# Patient Record
Sex: Female | Born: 1945 | Race: White | Hispanic: No | Smoking: Never smoker
Health system: Southern US, Community
[De-identification: ages and names within clinical notes are randomized; demographics above are authoritative.]

## PROBLEM LIST (undated history)

## (undated) DIAGNOSIS — F329 Major depressive disorder, single episode, unspecified: Secondary | ICD-10-CM

## (undated) DIAGNOSIS — C801 Malignant (primary) neoplasm, unspecified: Secondary | ICD-10-CM

## (undated) DIAGNOSIS — F419 Anxiety disorder, unspecified: Secondary | ICD-10-CM

## (undated) DIAGNOSIS — F40240 Claustrophobia: Secondary | ICD-10-CM

## (undated) DIAGNOSIS — F32A Depression, unspecified: Secondary | ICD-10-CM

## (undated) DIAGNOSIS — Z8619 Personal history of other infectious and parasitic diseases: Secondary | ICD-10-CM

## (undated) DIAGNOSIS — K219 Gastro-esophageal reflux disease without esophagitis: Secondary | ICD-10-CM

## (undated) HISTORY — DX: Depression, unspecified: F32.A

## (undated) HISTORY — DX: Personal history of other infectious and parasitic diseases: Z86.19

## (undated) HISTORY — DX: Claustrophobia: F40.240

## (undated) HISTORY — DX: Anxiety disorder, unspecified: F41.9

## (undated) HISTORY — DX: Malignant (primary) neoplasm, unspecified: C80.1

## (undated) HISTORY — PX: OOPHORECTOMY: SHX86

## (undated) HISTORY — PX: FOOT SURGERY: SHX648

## (undated) HISTORY — DX: Gastro-esophageal reflux disease without esophagitis: K21.9

## (undated) HISTORY — DX: Major depressive disorder, single episode, unspecified: F32.9

---

## 1996-10-30 DIAGNOSIS — Z853 Personal history of malignant neoplasm of breast: Secondary | ICD-10-CM | POA: Insufficient documentation

## 1996-10-30 HISTORY — PX: MASTECTOMY MODIFIED RADICAL: SHX5962

## 2012-05-24 LAB — TSH: TSH: 1.2 u[IU]/mL (ref <=5.90)

## 2012-05-24 LAB — LIPID PANEL
Cholesterol: 220 mg/dL — AB (ref 0–200)
HDL: 68 mg/dL (ref 35–70)
LDL Cholesterol: 136 mg/dL
Triglycerides: 78 mg/dL (ref 40–160)

## 2013-11-28 LAB — HM COLONOSCOPY

## 2014-10-31 LAB — HM MAMMOGRAPHY: HM MAMMO: NORMAL

## 2014-11-19 LAB — BASIC METABOLIC PANEL
BUN: 14 mg/dL (ref 4–21)
CREATININE: 0.8 mg/dL (ref <=1.1)

## 2014-11-19 LAB — CBC AND DIFFERENTIAL: HEMOGLOBIN: 13.8 g/dL (ref 12.0–16.0)

## 2015-05-26 ENCOUNTER — Encounter: Payer: Self-pay | Admitting: Internal Medicine

## 2015-05-26 ENCOUNTER — Other Ambulatory Visit: Payer: Self-pay | Admitting: Internal Medicine

## 2015-05-26 DIAGNOSIS — M810 Age-related osteoporosis without current pathological fracture: Secondary | ICD-10-CM | POA: Insufficient documentation

## 2015-05-26 DIAGNOSIS — K219 Gastro-esophageal reflux disease without esophagitis: Secondary | ICD-10-CM | POA: Insufficient documentation

## 2015-05-26 DIAGNOSIS — F064 Anxiety disorder due to known physiological condition: Secondary | ICD-10-CM | POA: Insufficient documentation

## 2015-05-26 DIAGNOSIS — Z8619 Personal history of other infectious and parasitic diseases: Secondary | ICD-10-CM | POA: Insufficient documentation

## 2015-05-26 DIAGNOSIS — F40240 Claustrophobia: Secondary | ICD-10-CM | POA: Insufficient documentation

## 2015-05-26 MED ORDER — LORAZEPAM 0.5 MG PO TABS: 0.5000 mg | ORAL_TABLET | Freq: Three times a day (TID) | ORAL | Status: DC | PRN

## 2015-11-16 ENCOUNTER — Encounter: Payer: Self-pay | Admitting: Internal Medicine

## 2015-11-16 ENCOUNTER — Ambulatory Visit (INDEPENDENT_AMBULATORY_CARE_PROVIDER_SITE_OTHER): Payer: Medicare Other | Admitting: Internal Medicine

## 2015-11-16 VITALS — BP 110/74 | HR 68 | Ht 67.5 in | Wt 145.0 lb

## 2015-11-16 DIAGNOSIS — E559 Vitamin D deficiency, unspecified: Secondary | ICD-10-CM

## 2015-11-16 DIAGNOSIS — Z79899 Other long term (current) drug therapy: Secondary | ICD-10-CM | POA: Diagnosis not present

## 2015-11-16 DIAGNOSIS — M129 Arthropathy, unspecified: Secondary | ICD-10-CM | POA: Diagnosis not present

## 2015-11-16 DIAGNOSIS — M1711 Unilateral primary osteoarthritis, right knee: Secondary | ICD-10-CM

## 2015-11-16 DIAGNOSIS — F064 Anxiety disorder due to known physiological condition: Secondary | ICD-10-CM

## 2015-11-16 DIAGNOSIS — Z23 Encounter for immunization: Secondary | ICD-10-CM | POA: Diagnosis not present

## 2015-11-16 DIAGNOSIS — Z853 Personal history of malignant neoplasm of breast: Secondary | ICD-10-CM | POA: Diagnosis not present

## 2015-11-16 NOTE — Progress Notes (Signed)
Date:  11/16/2015   Name:  Judy Sampson   DOB:  04-01-1946   MRN:  [id number]   Chief Complaint: Follow-up Anxiety Presents for follow-up visit. Patient reports no chest pain, palpitations or shortness of breath. Symptoms occur rarely. The severity of symptoms is mild. The quality of sleep is good. Nighttime awakenings: occasional.   Past treatments include benzodiazephines and SSRIs. The treatment provided significant relief. Compliance with prior treatments has been good.   History of Breast Cancer - followed by [facility] in [place].  They handle mammograms, CXR and exams.  She brings a list of labs needed.  They also treat her for Osteoporosis with Zometa.  They also manage ordering DEXA scans.  Right hip and knee pain - mild hip and knee aching at times.  Able to do almost everything she wants.  Takes tylenol as needed.  No weakness or falls.  Review of Systems  Constitutional: Negative for chills, diaphoresis and fatigue.  HENT: Negative for hearing loss.   Eyes: Negative for visual disturbance.  Respiratory: Negative for cough, chest tightness and shortness of breath.   Cardiovascular: Negative for chest pain, palpitations and leg swelling.  Genitourinary: Negative for dysuria.  Musculoskeletal: Positive for myalgias (right hip bursitis) and arthralgias (right knee).  Neurological: Negative for tremors, numbness and headaches.  Psychiatric/Behavioral: Negative for sleep disturbance and dysphoric mood.    Patient Active Problem List   Diagnosis Date Noted  . 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 Osteoporosis 05/26/2015  . Claustrophobia 05/26/2015  . Acid reflux 05/26/2015  . History of breast cancer in female 10/30/1996    Prior to Admission medications   Medication Sig Start Date End Date Taking? Authorizing Provider  MULTIPLE VITAMIN PO Take by mouth.   Yes Historical Provider, MD  Omega-3  Fatty Acids (FISH OIL BURP-LESS) 1000 MG CAPS Take by mouth.   Yes Historical Provider, MD  Probiotic CAPS Take by mouth.   Yes Historical Provider, MD  sertraline (ZOLOFT) 50 MG tablet Take 1 tablet by mouth daily. 11/19/14  Yes Historical Provider, MD  LORazepam (ATIVAN) 0.5 MG tablet Take 1 tablet (0.5 mg total) by mouth every 8 (eight) hours as needed for anxiety. Patient not taking: Reported on 11/16/2015 05/26/15   Glean Hess, MD    Allergies  Allergen Reactions  . Bisphosphonates     Past Surgical History  Procedure Laterality Date  . Oophorectomy Left   . Mastectomy modified radical Right 1998    XRT/Chemo + 5 yrs  Aromasin    Social History  Substance Use Topics  . Smoking status: Never Smoker   . Smokeless tobacco: Never Used  . Alcohol Use: 1.2 oz/week    2 Standard drinks or equivalent per week     Comment: occasional   Depression screen [facility] 2/9 11/16/2015  Decreased Interest 0  Down, Depressed, Hopeless 0  PHQ - 2 Score 0   Fall Risk  11/16/2015  Falls in the past year? No   Functional Status Survey: Is the patient deaf or have difficulty hearing?: No Does the patient have difficulty seeing, even when wearing glasses/contacts?: No Does the patient have difficulty concentrating, remembering, or making decisions?: No Does the patient have difficulty walking or climbing stairs?: No Does the patient have difficulty dressing or bathing?: No Does the patient have difficulty doing errands alone such as visiting a doctor's office or shopping?: No   Medication list has been  reviewed and updated.  Physical Exam  Constitutional: She is oriented to person, place, and time. She appears well-developed and well-nourished. No distress.  HENT:  Head: Normocephalic and atraumatic.  Neck: Normal range of motion. Neck supple. No thyromegaly present.  Cardiovascular: Normal rate, regular rhythm and normal heart sounds.   Pulmonary/Chest: Effort normal and breath sounds  normal. No respiratory distress.  Abdominal: Soft. Bowel sounds are normal.  Musculoskeletal: Normal range of motion. She exhibits no edema or tenderness.  Lymphadenopathy:    She has no cervical adenopathy.  Neurological: She is alert and oriented to person, place, and time. She has normal reflexes.  Skin: Skin is warm and dry. No rash noted.  Psychiatric: She has a normal mood and affect. Her behavior is normal. Thought content normal.  Nursing note and vitals reviewed.   BP 110/74 mmHg  Pulse 68  Ht 5' 7.5" (1.715 m)  Wt 145 lb (65.772 kg)  BMI 22.36 kg/m2  Assessment and Plan: 1. Anxiety disorder due to known physiological condition Doing well on low dose sertraline Has Lorazepam to use as needed but still has the whole Rx - TSH  2. History of breast cancer in female Followed by Oncology - CBC with Differential/Platelet  3. Arthritis of knee, right Continue activity as tolerated Tylenol prn  4. Vitamin D deficiency Continue supplement - Vitamin D 1,25 dihydroxy  5. Long term use of drug - Comprehensive metabolic panel - Uric acid - Lactate Dehydrogenase (LDH) - Magnesium - Phosphorus  6. Need for pneumococcal vaccination - Pneumococcal polysaccharide vaccine 23-valent greater than or equal to 2yo subcutaneous/IM   Halina Maidens, MD [facility]  11/16/2015

## 2015-11-16 NOTE — Patient Instructions (Signed)
Pneumococcal Polysaccharide Vaccine: What You Need to Know  1. Why get vaccinated?  Vaccination can protect older adults (and some children and younger adults) from pneumococcal disease.  Pneumococcal disease is caused by bacteria that can spread from person to person through close contact. It can cause ear infections, and it can also lead to more serious infections of the:   · Lungs (pneumonia),  · Blood (bacteremia), and  · Covering of the brain and spinal cord (meningitis). Meningitis can cause deafness and brain damage, and it can be fatal.  Anyone can get pneumococcal disease, but children under 2 years of age, people with certain medical conditions, adults over 65 years of age, and cigarette smokers are at the highest risk.  About 18,000 older adults die each year from pneumococcal disease in the [place].  Treatment of pneumococcal infections with penicillin and other drugs used to be more effective. But some strains of the disease have become resistant to these drugs. This makes prevention of the disease, through vaccination, even more important.  2. Pneumococcal polysaccharide vaccine (PPSV23)  Pneumococcal polysaccharide vaccine (PPSV23) protects against 23 types of pneumococcal bacteria. It will not prevent all pneumococcal disease.  PPSV23 is recommended for:  · All adults 65 years of age and older,  · Anyone 2 through 68 years of age with certain long-term health problems,  · Anyone 2 through 68 years of age with a weakened immune system,  · Adults 19 through 68 years of age who smoke cigarettes or have asthma.  Most people need only one dose of PPSV. A second dose is recommended for certain high-risk groups. People 65 and older should get a dose even if they have gotten one or more doses of the vaccine before they turned 65.  Your healthcare provider can give you more information about these recommendations.  Most healthy adults develop protection within 2 to 3 weeks of getting the shot.  3. Some  people should not get this vaccine  · Anyone who has had a life-threatening allergic reaction to PPSV should not get another dose.  · Anyone who has a severe allergy to any component of PPSV should not receive it. Tell your provider if you have any severe allergies.  · Anyone who is moderately or severely ill when the shot is scheduled may be asked to wait until they recover before getting the vaccine. Someone with a mild illness can usually be vaccinated.  · Children less than 2 years of age should not receive this vaccine.  · There is no evidence that PPSV is harmful to either a pregnant woman or to her fetus. However, as a precaution, women who need the vaccine should be vaccinated before becoming pregnant, if possible.  4. Risks of a vaccine reaction  With any medicine, including vaccines, there is a chance of side effects. These are usually mild and go away on their own, but serious reactions are also possible.  About half of people who get PPSV have mild side effects, such as redness or pain where the shot is given, which go away within about two days.  Less than 1 out of 100 people develop a fever, muscle aches, or more severe local reactions.  Problems that could happen after any vaccine:  · People sometimes faint after a medical procedure, including vaccination. Sitting or lying down for about 15 minutes can help prevent fainting, and injuries caused by a fall. Tell your doctor if you feel dizzy, or have vision changes or   ringing in the ears.  · Some people get severe pain in the shoulder and have difficulty moving the arm where a shot was given. This happens very rarely.  · Any medication can cause a severe allergic reaction. Such reactions from a vaccine are very rare, estimated at about 1 in a million doses, and would happen within a few minutes to a few hours after the vaccination.  As with any medicine, there is a very remote chance of a vaccine causing a serious injury or death.  The safety of  vaccines is always being monitored. For more information, visit: www.cdc.gov/vaccinesafety/  5. What if there is a serious reaction?  What should I look for?  Look for anything that concerns you, such as signs of a severe allergic reaction, very high fever, or unusual behavior.   Signs of a severe allergic reaction can include hives, swelling of the face and throat, difficulty breathing, a fast heartbeat, dizziness, and weakness. These would usually start a few minutes to a few hours after the vaccination.  What should I do?  If you think it is a severe allergic reaction or other emergency that can't wait, call 9-1-1 or get to the nearest hospital. Otherwise, call your doctor.  Afterward, the reaction should be reported to the Vaccine Adverse Event Reporting System (VAERS). Your doctor might file this report, or you can do it yourself through the VAERS web site at www.vaers.hhs.gov, or by calling 1-800-822-7967.   VAERS does not give medical advice.  6. How can I learn more?  · Ask your doctor. He or she can give you the vaccine package insert or suggest other sources of information.  · Call your local or state health department.  · Contact the Centers for Disease Control and Prevention (CDC):    Call 1-800-232-4636 (1-800-CDC-INFO) or    Visit CDC's website at www.cdc.gov/vaccines  CDC Pneumococcal Polysaccharide Vaccine VIS (02/20/14)     This information is not intended to replace advice given to you by your health care provider. Make sure you discuss any questions you have with your health care provider.     Document Released: 08/13/2006 Document Revised: 11/06/2014 Document Reviewed: 02/23/2014  Elsevier Interactive Patient Education ©2016 Elsevier Inc.

## 2015-11-20 LAB — CBC WITH DIFFERENTIAL/PLATELET
BASOS ABS: 0 10*3/uL (ref 0.0–0.2)
Basos: 1 %
EOS (ABSOLUTE): 0.1 10*3/uL (ref 0.0–0.4)
Eos: 1 %
HEMATOCRIT: 39.3 % (ref 34.0–46.6)
Hemoglobin: 13.1 g/dL (ref 11.1–15.9)
Immature Grans (Abs): 0 10*3/uL (ref 0.0–0.1)
Immature Granulocytes: 1 %
LYMPHS ABS: 2 10*3/uL (ref 0.7–3.1)
Lymphs: 38 %
MCH: 29.6 pg (ref 26.6–33.0)
MCHC: 33.3 g/dL (ref 31.5–35.7)
MCV: 89 fL (ref 79–97)
Monocytes Absolute: 0.4 10*3/uL (ref 0.1–0.9)
Monocytes: 8 %
NRBC: 0 % (ref 0–0)
Neutrophils Absolute: 2.8 10*3/uL (ref 1.4–7.0)
Neutrophils: 51 %
Platelets: 215 10*3/uL (ref 150–379)
RBC: 4.42 x10E6/uL (ref 3.77–5.28)
RDW: 13.8 % (ref 12.3–15.4)
WBC: 5.4 10*3/uL (ref 3.4–10.8)

## 2015-11-20 LAB — COMPREHENSIVE METABOLIC PANEL
ALT: 19 IU/L (ref 0–32)
AST: 24 IU/L (ref 0–40)
Albumin/Globulin Ratio: 1.6 (ref 1.1–2.5)
Albumin: 4.2 g/dL (ref 3.6–4.8)
Alkaline Phosphatase: 67 IU/L (ref 39–117)
BUN/Creatinine Ratio: 16 (ref 11–26)
BUN: 14 mg/dL (ref 8–27)
Bilirubin Total: 0.2 mg/dL (ref 0.0–1.2)
CALCIUM: 9.6 mg/dL (ref 8.7–10.3)
CO2: 25 mmol/L (ref 18–29)
Chloride: 99 mmol/L (ref 96–106)
Creatinine, Ser: 0.86 mg/dL (ref 0.57–1.00)
GFR calc Af Amer: 81 mL/min/{1.73_m2} (ref >59)
GFR, EST NON AFRICAN AMERICAN: 70 mL/min/{1.73_m2} (ref >59)
GLOBULIN, TOTAL: 2.7 g/dL (ref 1.5–4.5)
Glucose: 77 mg/dL (ref 65–99)
Potassium: 4.7 mmol/L (ref 3.5–5.2)
SODIUM: 140 mmol/L (ref 134–144)
Total Protein: 6.9 g/dL (ref 6.0–8.5)

## 2015-11-20 LAB — TSH: TSH: 1.14 u[IU]/mL (ref 0.450–4.500)

## 2015-11-20 LAB — PHOSPHORUS: Phosphorus: 3.4 mg/dL (ref 2.5–4.5)

## 2015-11-20 LAB — MAGNESIUM: Magnesium: 2.2 mg/dL (ref 1.6–2.3)

## 2015-11-20 LAB — URIC ACID: Uric Acid: 4.5 mg/dL (ref 2.5–7.1)

## 2015-11-20 LAB — VITAMIN D 1,25 DIHYDROXY
VITAMIN D3 1, 25 (OH): 78 pg/mL
Vitamin D 1, 25 (OH)2 Total: 82 pg/mL
Vitamin D2 1, 25 (OH)2: <10 pg/mL

## 2015-11-20 LAB — LACTATE DEHYDROGENASE: LDH: 175 IU/L (ref 119–226)

## 2015-11-22 ENCOUNTER — Telehealth: Payer: Self-pay

## 2015-11-22 NOTE — Telephone Encounter (Signed)
Spoke with pt. Pt. Advised and I have sent a copy to her oncologist as requested.

## 2015-11-22 NOTE — Telephone Encounter (Signed)
-----   Message from Glean Hess, MD sent at 11/20/2015  5:06 PM EST ----- Labs are all normal.  MH - please copy and fax to her oncologist (sheet is in my box).

## 2015-11-22 NOTE — Progress Notes (Signed)
Spoke with pt. Pt. Advised of results. I have faxed her results to her oncologist as requested.

## 2016-02-21 ENCOUNTER — Other Ambulatory Visit: Payer: Self-pay

## 2016-02-21 MED ORDER — SERTRALINE HCL 50 MG PO TABS: 50.0000 mg | ORAL_TABLET | Freq: Every day | ORAL | Status: DC

## 2016-02-21 NOTE — Telephone Encounter (Signed)
Patient called in requesting refills of medication.

## 2016-05-15 ENCOUNTER — Encounter: Payer: Self-pay | Admitting: Internal Medicine

## 2016-05-15 ENCOUNTER — Ambulatory Visit (INDEPENDENT_AMBULATORY_CARE_PROVIDER_SITE_OTHER): Payer: Medicare Other | Admitting: Internal Medicine

## 2016-05-15 VITALS — BP 120/80 | HR 84 | Resp 16 | Ht 67.5 in | Wt 143.0 lb

## 2016-05-15 DIAGNOSIS — F064 Anxiety disorder due to known physiological condition: Secondary | ICD-10-CM

## 2016-05-15 DIAGNOSIS — F40240 Claustrophobia: Secondary | ICD-10-CM | POA: Diagnosis not present

## 2016-05-15 MED ORDER — CLONAZEPAM 0.5 MG PO TABS: 0.2500 mg | ORAL_TABLET | Freq: Two times a day (BID) | ORAL | Status: DC | PRN

## 2016-05-15 NOTE — Progress Notes (Signed)
Date:  05/15/2016   Name:  Judy Sampson   DOB:  Aug 13, 1946   MRN:  [id number]   Chief Complaint: Anxiety Anxiety Presents for follow-up visit. Symptoms include excessive worry and nervous/anxious behavior. Patient reports no chest pain, palpitations or shortness of breath.   Past treatments include SSRIs and benzodiazephines. Compliance with prior treatments has been good.  She is contemplating flying to visit her daughter out of state.  Daughter is having psychological issues and she is afraid that the lorazepam will not be strong enough.    She had a similar issue about 5 years ago.  She was given clonazepam 0.5 mg bid - it worked very well and she would like to have that to take if needed.    Review of Systems  Constitutional: Negative for chills, diaphoresis and fatigue.  HENT: Negative for hearing loss.   Eyes: Negative for visual disturbance.  Respiratory: Negative for cough, chest tightness and shortness of breath.   Cardiovascular: Negative for chest pain, palpitations and leg swelling.  Genitourinary: Negative for dysuria.  Neurological: Negative for tremors, numbness and headaches.  Psychiatric/Behavioral: Negative for sleep disturbance and dysphoric mood. The patient is nervous/anxious.     Patient Active Problem List   Diagnosis Date Noted  . 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 Osteoporosis 05/26/2015  . Claustrophobia 05/26/2015  . Acid reflux 05/26/2015  . History of breast cancer in female 10/30/1996    Prior to Admission medications   Medication Sig Start Date End Date Taking? Authorizing Provider  LORazepam (ATIVAN) 0.5 MG tablet Take 1 tablet (0.5 mg total) by mouth every 8 (eight) hours as needed for anxiety. 05/26/15  Yes Glean Hess, MD  MULTIPLE VITAMIN PO Take by mouth.   Yes Historical Provider, MD  Omega-3 Fatty Acids (FISH OIL BURP-LESS) 1000 MG CAPS Take by mouth.   Yes  Historical Provider, MD  Probiotic CAPS Take by mouth.   Yes Historical Provider, MD  sertraline (ZOLOFT) 50 MG tablet Take 1 tablet (50 mg total) by mouth daily. 02/21/16  Yes Glean Hess, MD    Allergies  Allergen Reactions  . Bisphosphonates     Past Surgical History  Procedure Laterality Date  . Oophorectomy Left   . Mastectomy modified radical Right 1998    XRT/Chemo + 5 yrs  Aromasin    Social History  Substance Use Topics  . Smoking status: Never Smoker   . Smokeless tobacco: Never Used  . Alcohol Use: 1.2 oz/week    2 Standard drinks or equivalent per week     Comment: occasional     Medication list has been reviewed and updated.   Physical Exam  Constitutional: She is oriented to person, place, and time. She appears well-developed. No distress.  HENT:  Head: Normocephalic and atraumatic.  Pulmonary/Chest: Effort normal. No respiratory distress.  Musculoskeletal: Normal range of motion.  Neurological: She is alert and oriented to person, place, and time.  Skin: Skin is warm and dry. No rash noted.  Psychiatric: Her behavior is normal. Thought content normal. Her mood appears anxious.    BP 120/80 mmHg  Pulse 84  Resp 16  Ht 5' 7.5" (1.715 m)  Wt 143 lb (64.864 kg)  BMI 22.05 kg/m2  SpO2 98%  Assessment and Plan: 1. Anxiety disorder due to known physiological condition - clonazePAM (KLONOPIN) 0.5 MG tablet; Take 0.5 tablets (0.25 mg total) by mouth 2 (two) times daily  as needed for anxiety.  Dispense: 20 tablet; Refill: 0  2. Clover, MD [facility]  05/15/2016

## 2016-10-04 ENCOUNTER — Ambulatory Visit (INDEPENDENT_AMBULATORY_CARE_PROVIDER_SITE_OTHER): Payer: Medicare Other | Admitting: Internal Medicine

## 2016-10-04 ENCOUNTER — Encounter: Payer: Self-pay | Admitting: Internal Medicine

## 2016-10-04 VITALS — BP 110/74 | HR 72 | Resp 16 | Ht 67.5 in | Wt 140.0 lb

## 2016-10-04 DIAGNOSIS — Z0000 Encounter for general adult medical examination without abnormal findings: Secondary | ICD-10-CM

## 2016-10-04 DIAGNOSIS — Z853 Personal history of malignant neoplasm of breast: Secondary | ICD-10-CM

## 2016-10-04 DIAGNOSIS — H9319 Tinnitus, unspecified ear: Secondary | ICD-10-CM | POA: Diagnosis not present

## 2016-10-04 DIAGNOSIS — Z1159 Encounter for screening for other viral diseases: Secondary | ICD-10-CM | POA: Diagnosis not present

## 2016-10-04 DIAGNOSIS — F064 Anxiety disorder due to known physiological condition: Secondary | ICD-10-CM | POA: Diagnosis not present

## 2016-10-04 DIAGNOSIS — M62838 Other muscle spasm: Secondary | ICD-10-CM | POA: Insufficient documentation

## 2016-10-04 DIAGNOSIS — M810 Age-related osteoporosis without current pathological fracture: Secondary | ICD-10-CM | POA: Diagnosis not present

## 2016-10-04 DIAGNOSIS — I499 Cardiac arrhythmia, unspecified: Secondary | ICD-10-CM | POA: Diagnosis not present

## 2016-10-04 DIAGNOSIS — M899 Disorder of bone, unspecified: Secondary | ICD-10-CM

## 2016-10-04 LAB — POCT URINALYSIS DIPSTICK
BILIRUBIN UA: NEGATIVE
Blood, UA: NEGATIVE
GLUCOSE UA: NEGATIVE
KETONES UA: NEGATIVE
LEUKOCYTES UA: NEGATIVE
NITRITE UA: NEGATIVE
PH UA: 6
Protein, UA: NEGATIVE
Spec Grav, UA: <=1.005
Urobilinogen, UA: 0.2

## 2016-10-04 MED ORDER — SERTRALINE HCL 50 MG PO TABS: 50.0000 mg | ORAL_TABLET | Freq: Every day | ORAL | 3 refills | Status: DC

## 2016-10-04 NOTE — Patient Instructions (Signed)
Health Maintenance  Topic Date Due  . Hepatitis C Screening  [birth year]  . MAMMOGRAM  11/01/2015  . COLONOSCOPY  11/29/2023  . TETANUS/TDAP  08/30/2025  . INFLUENZA VACCINE  Addressed  . DEXA SCAN  Addressed  . ZOSTAVAX  Completed  . PNA vac Low Risk Adult  Completed

## 2016-10-04 NOTE — Progress Notes (Signed)
Patient: Judy Sampson, Female    DOB: 06-29-1946, 70 y.o.   MRN: [id number] Visit Date: 10/04/2016  Today's Provider: Halina Maidens, MD   Chief Complaint  Patient presents with  . Medicare Wellness   Subjective:    Annual wellness visit Judy Sampson is a 70 y.o. female who presents today for her Subsequent Annual Wellness Visit. She feels fairly well. She reports exercising walking daily. She reports she is sleeping well.   ----------------------------------------------------------- Anxiety  Presents for follow-up visit. Patient reports no chest pain, decreased concentration, dizziness, nervous/anxious behavior or palpitations. Symptoms occur occasionally. The quality of sleep is good.    Tender scalp - noticed on the left recently.  Has some sinus congestion most of the time.  Feels like buzzing sound in head as well.  She has seen the chiropractor once and felt better briefly.  Palpitations - she has noticed three or four heavy beats in her chest, mostly at night while relaxing to go to sleep.  No associated pain or shortness of breath.  They do not occur sx during the day or with exercise.  Tinnitus - not sure which side but hears a humming sound.  No ear pain or decreased hearing.  She has some chronic sinus congestion for which she takes a homeopathic remedy.  Review of Systems  Constitutional: Negative for chills, fatigue, fever and unexpected weight change.  HENT: Positive for congestion, sinus pressure and tinnitus. Negative for dental problem, ear pain, hearing loss, trouble swallowing and voice change.   Eyes: Negative for visual disturbance.  Cardiovascular: Negative for chest pain, palpitations and leg swelling.  Gastrointestinal: Negative for abdominal pain, blood in stool, constipation, diarrhea and vomiting.  Endocrine: Negative for polydipsia and polyuria.  Genitourinary: Negative for dysuria, frequency and urgency.  Musculoskeletal: Positive for neck stiffness.  Negative for arthralgias and gait problem.  Skin: Negative for rash.  Allergic/Immunologic: Positive for environmental allergies.  Neurological: Positive for headaches. Negative for dizziness, tremors, weakness, light-headedness and numbness.  Psychiatric/Behavioral: Negative for decreased concentration and sleep disturbance. The patient is not nervous/anxious.     Social History   Social History  . Marital status: Unknown    Spouse name: N/A  . Number of children: N/A  . Years of education: N/A   Occupational History  . Not on file.   Social History Main Topics  . Smoking status: Never Smoker  . Smokeless tobacco: Never Used  . Alcohol use 1.2 oz/week    2 Standard drinks or equivalent per week     Comment: occasional  . Drug use: No  . Sexual activity: Not on file   Other Topics Concern  . Not on file   Social History Narrative  . No narrative on file    Patient Active Problem List   Diagnosis Date Noted  . Age-related osteoporosis without current pathological fracture 10/04/2016  . 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 Claustrophobia 05/26/2015  . Acid reflux 05/26/2015  . History of breast cancer in female 10/30/1996    Past Surgical History:  Procedure Laterality Date  . MASTECTOMY MODIFIED RADICAL Right 1998   XRT/Chemo + 5 yrs  Aromasin  . OOPHORECTOMY Left     Her family history includes Diabetes in her father and mother.     Previous Medications   MULTIPLE VITAMIN PO    Take by mouth.   OMEGA-3 FATTY ACIDS (FISH OIL BURP-LESS) 1000 MG CAPS    Take  by mouth.   PROBIOTIC CAPS    Take by mouth.   SERTRALINE (ZOLOFT) 50 MG TABLET    Take 1 tablet (50 mg total) by mouth daily.    Patient Care Team: Glean Hess, MD as PCP - General (Family Medicine)      Objective:   Vitals: BP 110/74   Pulse 72   Resp 16   Ht 5' 7.5" (1.715 m)   Wt 140 lb (63.5 kg)   SpO2 100%   BMI  21.60 kg/m   Physical Exam  Constitutional: She is oriented to person, place, and time. She appears well-developed and well-nourished. No distress.  HENT:  Head: Normocephalic and atraumatic.  Right Ear: Tympanic membrane and ear canal normal.  Left Ear: Tympanic membrane and ear canal normal.  Nose: Right sinus exhibits no maxillary sinus tenderness. Left sinus exhibits no maxillary sinus tenderness.  Mouth/Throat: Uvula is midline and oropharynx is clear and moist.  Eyes: Conjunctivae and EOM are normal. Right eye exhibits no discharge. Left eye exhibits no discharge. No scleral icterus.  Neck: Normal range of motion. Carotid bruit is not present. No erythema present. No thyromegaly present.  Cardiovascular: Normal rate, regular rhythm, normal heart sounds, intact distal pulses and normal pulses.   Occasional extrasystoles are present.  Pulmonary/Chest: Effort normal. No respiratory distress. She has no wheezes. Right breast exhibits skin change (post mastectomy scar with no nodules). Left breast exhibits no mass, no nipple discharge, no skin change and no tenderness.  Abdominal: Soft. Bowel sounds are normal. There is no hepatosplenomegaly. There is no tenderness. There is no CVA tenderness.  Musculoskeletal: Normal range of motion.       Cervical back: She exhibits tenderness and spasm.  No Temporal artery tenderness; no TMJ pain or click  Lymphadenopathy:    She has no cervical adenopathy.    She has no axillary adenopathy.  Neurological: She is alert and oriented to person, place, and time. She has normal reflexes. No cranial nerve deficit or sensory deficit.  Skin: Skin is warm, dry and intact. No rash noted.  Psychiatric: She has a normal mood and affect. Her speech is normal and behavior is normal. Thought content normal.  Nursing note and vitals reviewed.   Activities of Daily Living In your present state of health, do you have any difficulty performing the following  activities: 10/04/2016 05/15/2016  Hearing? N N  Vision? N N  Difficulty concentrating or making decisions? N N  Walking or climbing stairs? N N  Dressing or bathing? N N  Doing errands, shopping? N N  Preparing Food and eating ? N -  Using the Toilet? N -  In the past six months, have you accidently leaked urine? Y -  Do you have problems with loss of bowel control? N -  Managing your Medications? N -  Managing your Finances? N -  Housekeeping or managing your Housekeeping? N -  Some recent data might be hidden    Fall Risk Assessment Fall Risk  10/04/2016 05/15/2016 11/16/2015  Falls in the past year? No No No      Depression Screen PHQ 2/9 Scores 10/04/2016 05/15/2016 11/16/2015  PHQ - 2 Score 0 0 0    6CIT Screen 10/04/2016  What Year? 0 points  What month? 0 points  What time? 0 points  Count back from 20 0 points  Months in reverse 0 points  Repeat phrase 0 points  Total Score 0    Medicare Annual Wellness  Visit Summary:  Reviewed patient's Family Medical History Reviewed and updated list of patient's medical providers Assessment of cognitive impairment was done Assessed patient's functional ability Established a written schedule for health screening [place] Completed and Reviewed  Exercise Activities and Dietary recommendations Goals    None      Immunization History  Administered Date(s) Administered  . Influenza-Unspecified 07/01/2015  . Pneumococcal Conjugate-13 11/19/2014  . Pneumococcal Polysaccharide-23 11/16/2015  . Tdap 08/31/2015  . Zoster 11/01/2007    Health Maintenance  Topic Date Due  . Hepatitis C Screening  November 26, 1945  . MAMMOGRAM  11/01/2015  . COLONOSCOPY  11/29/2023  . TETANUS/TDAP  08/30/2025  . INFLUENZA VACCINE  Addressed  . DEXA SCAN  Addressed  . ZOSTAVAX  Completed  . PNA vac Low Risk Adult  Completed    Discussed health benefits of physical activity, and encouraged her to engage in regular exercise  appropriate for her age and condition.    ------------------------------------------------------------------------------------------------------------  Assessment & Plan:  1. Medicare annual wellness visit, subsequent Measures satisfied - POCT urinalysis dipstick - Lipid panel  2. Anxiety disorder due to known physiological condition Controlled - continue Zoloft 50 mg  - TSH  3. Age-related osteoporosis without current pathological fracture Continue calcium + D and exercise  4. Need for hepatitis C screening test - Hepatitis C antibody  5. Cardiac arrhythmia, unspecified cardiac arrhythmia type PAC single caught on RS - patient reassured - EKG 12-Lead - Comprehensive metabolic panel - TSH  6. Bone disease Followed by Oncology - intolerant of fosamax - VITAMIN D 25 Hydroxy (Vit-D Deficiency, Fractures) - Uric acid - Magnesium - Phosphorus - Lactate Dehydrogenase (LDH)  7. Personal history of malignant neoplasm of breast Followed by Oncology who orders Mammograms - CBC with Differential/Platelet - Comprehensive metabolic panel  8. Muscle spasms of neck Advil, heat and chiropractic care  9. Tinnitus, unspecified laterality If persistent, will refer to ENT   Halina Maidens, MD [facility]  10/04/2016

## 2016-10-05 LAB — CBC WITH DIFFERENTIAL/PLATELET
Basophils Absolute: 0 10*3/uL (ref 0.0–0.2)
Basos: 0 %
EOS (ABSOLUTE): 0.1 10*3/uL (ref 0.0–0.4)
Eos: 2 %
HEMATOCRIT: 39.7 % (ref 34.0–46.6)
Hemoglobin: 13.2 g/dL (ref 11.1–15.9)
IMMATURE GRANULOCYTES: 0 %
Immature Grans (Abs): 0 10*3/uL (ref 0.0–0.1)
LYMPHS: 40 %
Lymphocytes Absolute: 1.9 10*3/uL (ref 0.7–3.1)
MCH: 29.4 pg (ref 26.6–33.0)
MCHC: 33.2 g/dL (ref 31.5–35.7)
MCV: 88 fL (ref 79–97)
MONOCYTES: 11 %
MONOS ABS: 0.5 10*3/uL (ref 0.1–0.9)
NEUTROS ABS: 2.1 10*3/uL (ref 1.4–7.0)
Neutrophils: 47 %
PLATELETS: 208 10*3/uL (ref 150–379)
RBC: 4.49 x10E6/uL (ref 3.77–5.28)
RDW: 14 % (ref 12.3–15.4)
WBC: 4.6 10*3/uL (ref 3.4–10.8)

## 2016-10-05 LAB — COMPREHENSIVE METABOLIC PANEL
A/G RATIO: 1.7 (ref 1.2–2.2)
ALK PHOS: 67 IU/L (ref 39–117)
ALT: 18 IU/L (ref 0–32)
AST: 24 IU/L (ref 0–40)
Albumin: 4.4 g/dL (ref 3.6–4.8)
BILIRUBIN TOTAL: 0.4 mg/dL (ref 0.0–1.2)
BUN/Creatinine Ratio: 28 (ref 12–28)
BUN: 19 mg/dL (ref 8–27)
CHLORIDE: 100 mmol/L (ref 96–106)
CO2: 27 mmol/L (ref 18–29)
Calcium: 9.6 mg/dL (ref 8.7–10.3)
Creatinine, Ser: 0.69 mg/dL (ref 0.57–1.00)
GFR calc Af Amer: 103 mL/min/{1.73_m2} (ref >59)
GFR calc non Af Amer: 90 mL/min/{1.73_m2} (ref >59)
GLOBULIN, TOTAL: 2.6 g/dL (ref 1.5–4.5)
Glucose: 82 mg/dL (ref 65–99)
POTASSIUM: 4.7 mmol/L (ref 3.5–5.2)
SODIUM: 139 mmol/L (ref 134–144)
Total Protein: 7 g/dL (ref 6.0–8.5)

## 2016-10-05 LAB — URIC ACID: URIC ACID: 4.5 mg/dL (ref 2.5–7.1)

## 2016-10-05 LAB — PHOSPHORUS: PHOSPHORUS: 3.6 mg/dL (ref 2.5–4.5)

## 2016-10-05 LAB — MAGNESIUM: MAGNESIUM: 2.1 mg/dL (ref 1.6–2.3)

## 2016-10-05 LAB — HEPATITIS C ANTIBODY

## 2016-10-05 LAB — LIPID PANEL
CHOL/HDL RATIO: 3 ratio (ref 0.0–4.4)
Cholesterol, Total: 252 mg/dL — ABNORMAL HIGH (ref 100–199)
HDL: 83 mg/dL (ref >39)
LDL Calculated: 156 mg/dL — ABNORMAL HIGH (ref 0–99)
Triglycerides: 66 mg/dL (ref 0–149)
VLDL Cholesterol Cal: 13 mg/dL (ref 5–40)

## 2016-10-05 LAB — VITAMIN D 25 HYDROXY (VIT D DEFICIENCY, FRACTURES): VIT D 25 HYDROXY: 34.5 ng/mL (ref 30.0–100.0)

## 2016-10-05 LAB — TSH: TSH: 0.949 u[IU]/mL (ref 0.450–4.500)

## 2016-10-05 LAB — LACTATE DEHYDROGENASE: LDH: 187 IU/L (ref 119–226)

## 2017-02-08 LAB — HM DEXA SCAN

## 2017-03-07 ENCOUNTER — Other Ambulatory Visit: Payer: Self-pay | Admitting: Internal Medicine

## 2017-03-07 ENCOUNTER — Other Ambulatory Visit: Payer: Self-pay

## 2017-03-07 MED ORDER — SERTRALINE HCL 50 MG PO TABS: ORAL_TABLET | ORAL | 1 refills | Status: DC

## 2017-03-07 NOTE — Telephone Encounter (Signed)
Patient states she will call back later for CPE.

## 2017-07-03 ENCOUNTER — Ambulatory Visit
Admission: RE | Admit: 2017-07-03 | Discharge: 2017-07-03 | Disposition: A | Discharge status: Home / Self Care | Payer: Medicare Other | Source: Ambulatory Visit | Attending: Internal Medicine | Admitting: Internal Medicine

## 2017-07-03 ENCOUNTER — Other Ambulatory Visit: Payer: Self-pay | Admitting: Internal Medicine

## 2017-07-03 ENCOUNTER — Encounter: Payer: Self-pay | Admitting: Internal Medicine

## 2017-07-03 ENCOUNTER — Ambulatory Visit (INDEPENDENT_AMBULATORY_CARE_PROVIDER_SITE_OTHER): Payer: Medicare Other | Admitting: Internal Medicine

## 2017-07-03 VITALS — BP 110/64 | HR 77 | Ht 67.5 in | Wt 139.0 lb

## 2017-07-03 DIAGNOSIS — M19072 Primary osteoarthritis, left ankle and foot: Secondary | ICD-10-CM | POA: Diagnosis not present

## 2017-07-03 DIAGNOSIS — M79672 Pain in left foot: Secondary | ICD-10-CM | POA: Insufficient documentation

## 2017-07-03 DIAGNOSIS — D126 Benign neoplasm of colon, unspecified: Secondary | ICD-10-CM | POA: Diagnosis not present

## 2017-07-03 DIAGNOSIS — M7989 Other specified soft tissue disorders: Secondary | ICD-10-CM | POA: Diagnosis not present

## 2017-07-03 DIAGNOSIS — F064 Anxiety disorder due to known physiological condition: Secondary | ICD-10-CM

## 2017-07-03 MED ORDER — CLONAZEPAM 0.5 MG PO TABS: 0.2500 mg | ORAL_TABLET | Freq: Two times a day (BID) | ORAL | 0 refills | Status: DC | PRN

## 2017-07-03 NOTE — Progress Notes (Signed)
Date:  07/03/2017   Name:  Judy Sampson   DOB:  Apr 20, 1946   MRN:  [id number]   Chief Complaint: Foot Pain (Left foot pain on outer foot. Got new walking shoes noticed some change in foot. Wants make sure its nothing concerning. ) and Anxiety (Requesting refill on Lorazepam. States she only takes it when she has to fly. Has been several years since previous. ) Foot Pain  This is a new problem. The current episode started more than 1 month ago. The problem occurs constantly. The problem has been unchanged. Associated symptoms include arthralgias and myalgias. Pertinent negatives include no abdominal pain, chest pain, chills or fatigue. She has tried position changes for the symptoms. The treatment provided no relief.  Anxiety  Presents for follow-up visit. Symptoms include excessive worry. Patient reports no chest pain, nervous/anxious behavior, palpitations or shortness of breath. Symptoms occur rarely (only when flying).    Colon Polyps - she had adenomatous polyps on colonoscopy 3-1/2 years ago. She is now due for follow-up. She was seen at [place] did not like the facility. She like to go to [facility].    Review of Systems  Constitutional: Negative for chills and fatigue.  Respiratory: Negative for chest tightness, shortness of breath and wheezing.   Cardiovascular: Negative for chest pain and palpitations.  Gastrointestinal: Negative for abdominal pain.  Musculoskeletal: Positive for arthralgias, gait problem and myalgias.  Psychiatric/Behavioral: Negative for dysphoric mood. The patient is not nervous/anxious.     Patient Active Problem List   Diagnosis Date Noted  . Age-related osteoporosis without current pathological fracture 10/04/2016  . Muscle spasms of neck 10/04/2016  . Bone disease 10/04/2016  . Cardiac arrhythmia 10/04/2016  . 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 Claustrophobia  05/26/2015  . Acid reflux 05/26/2015  . Personal history of malignant neoplasm of breast 10/30/1996    Prior to Admission medications   Medication Sig Start Date End Date Taking? Authorizing Provider  Flaxseed, Linseed, (FLAX SEED OIL) 1000 MG CAPS Take by mouth.   Yes [provider]  MULTIPLE VITAMIN PO Take by mouth.   Yes [provider]  Omega-3 Fatty Acids (FISH OIL BURP-LESS) 1000 MG CAPS Take by mouth.   Yes [provider]  Probiotic CAPS Take by mouth.   Yes [provider]  sertraline (ZOLOFT) 50 MG tablet TAKE 1 TABLET(50 MG) BY MOUTH DAILY 03/07/17  Yes Glean Hess, MD    Allergies  Allergen Reactions  . Bisphosphonates     Past Surgical History:  Procedure Laterality Date  . MASTECTOMY MODIFIED RADICAL Right 1998   XRT/Chemo + 5 yrs  Aromasin  . OOPHORECTOMY Left     Social History  Substance Use Topics  . Smoking status: Never Smoker  . Smokeless tobacco: Never Used  . Alcohol use 1.2 oz/week    2 Standard drinks or equivalent per week     Comment: occasional     Medication list has been reviewed and updated.  PHQ 2/9 Scores 10/04/2016 05/15/2016 11/16/2015  PHQ - 2 Score 0 0 0    Physical Exam  Constitutional: She is oriented to person, place, and time. She appears well-developed. No distress.  HENT:  Head: Normocephalic and atraumatic.  Neck: Normal range of motion. Neck supple.  Cardiovascular: Normal rate, regular rhythm and normal heart sounds.   Pulmonary/Chest: Effort normal and breath sounds normal. No respiratory distress.  Musculoskeletal:  She exhibits no edema.       Feet:  Neurological: She is alert and oriented to person, place, and time.  Skin: Skin is warm and dry. No rash noted.  Psychiatric: She has a normal mood and affect. Her behavior is normal. Thought content normal.  Nursing note and vitals reviewed.   BP 110/64   Pulse 77   Ht 5' 7.5" (1.715 m)   Wt 139 lb (63 kg)   SpO2 99%    BMI 21.45 kg/m   Assessment and Plan: 1. Anxiety disorder due to known physiological condition Refill for upcoming travel to [place] - clonazePAM (KLONOPIN) 0.5 MG tablet; Take 0.5 tablets (0.25 mg total) by mouth 2 (two) times daily as needed for anxiety.  Dispense: 20 tablet; Refill: 0  2. Adenomatous polyp of colon, unspecified part of colon Call [facility] and ask for another service location If needed, we can do another referral  3. Foot pain, left Wear comfortable shoes May need Podiatry evaluation - DG Foot Complete Left; Future   Meds ordered this encounter  Medications  . clonazePAM (KLONOPIN) 0.5 MG tablet    Sig: Take 0.5 tablets (0.25 mg total) by mouth 2 (two) times daily as needed for anxiety.    Dispense:  20 tablet    Refill:  0    Partially dictated using Editor, commissioning. Any errors are unintentional.  Halina Maidens, MD [facility]  07/03/2017

## 2017-07-03 NOTE — Progress Notes (Signed)
Pt informed of Xray- stated she is okay about referral. In the meantime will try to elevate leg, and try OTC anti-inflammatory meds. Will wait for call about scheduling Podiatry appt.

## 2017-07-16 ENCOUNTER — Encounter: Payer: Self-pay | Admitting: Internal Medicine

## 2017-07-16 ENCOUNTER — Ambulatory Visit (INDEPENDENT_AMBULATORY_CARE_PROVIDER_SITE_OTHER): Payer: Medicare Other | Admitting: Internal Medicine

## 2017-07-16 VITALS — BP 110/68 | HR 67 | Ht 67.5 in | Wt 138.8 lb

## 2017-07-16 DIAGNOSIS — N3001 Acute cystitis with hematuria: Secondary | ICD-10-CM

## 2017-07-16 LAB — POCT URINALYSIS DIPSTICK
Bilirubin, UA: NEGATIVE
Glucose, UA: NEGATIVE
Ketones, UA: NEGATIVE
NITRITE UA: NEGATIVE
Protein, UA: NEGATIVE
Spec Grav, UA: 1.01 (ref 1.010–1.025)
Urobilinogen, UA: 0.2 E.U./dL
pH, UA: 5 (ref 5.0–8.0)

## 2017-07-16 MED ORDER — CIPROFLOXACIN HCL 250 MG PO TABS: 250.0000 mg | ORAL_TABLET | Freq: Two times a day (BID) | ORAL | 0 refills | Status: DC

## 2017-07-16 NOTE — Progress Notes (Signed)
Date:  07/16/2017   Name:  Judy Sampson   DOB:  [birth year]   MRN:  [id number]   Chief Complaint: Urinary Tract Infection (X 4 days. Started with left lower back pain. Burning when urination. Urgency to void. Drinking lots of fluids. Burning comes and goes. Back pain still present. Used ice and heat. ) Urinary Tract Infection   This is a new problem. The current episode started in the past 7 days. The problem occurs every urination. The problem has been gradually improving. The quality of the pain is described as burning. The pain is mild. There has been no fever. There is no history of pyelonephritis. Associated symptoms include frequency and urgency. Pertinent negatives include no chills or nausea. She has tried increased fluids for the symptoms. The treatment provided mild relief.      Review of Systems  Constitutional: Negative for chills and unexpected weight change.  Respiratory: Negative for chest tightness and shortness of breath.   Gastrointestinal: Negative for nausea.  Genitourinary: Positive for frequency and urgency.  Musculoskeletal: Positive for back pain.    Patient Active Problem List   Diagnosis Date Noted  . Adenomatous polyp of colon 07/03/2017  . Foot pain, left 07/03/2017  . Age-related osteoporosis without current pathological fracture 10/04/2016  . Muscle spasms of neck 10/04/2016  . Bone disease 10/04/2016  . Cardiac arrhythmia 10/04/2016  . 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 Claustrophobia 05/26/2015  . Acid reflux 05/26/2015  . Personal history of malignant neoplasm of breast 10/30/1996    Prior to Admission medications   Medication Sig Start Date End Date Taking? Authorizing Provider  clonazePAM (KLONOPIN) 0.5 MG tablet Take 0.5 tablets (0.25 mg total) by mouth 2 (two) times daily as needed for anxiety. 07/03/17   Glean Hess, MD  Flaxseed, Linseed, (FLAX SEED OIL) 1000 MG  CAPS Take by mouth.    [provider]  MULTIPLE VITAMIN PO Take by mouth.    [provider]  Omega-3 Fatty Acids (FISH OIL BURP-LESS) 1000 MG CAPS Take by mouth.    [provider]  Probiotic CAPS Take by mouth.    [provider]  sertraline (ZOLOFT) 50 MG tablet TAKE 1 TABLET(50 MG) BY MOUTH DAILY 03/07/17   Glean Hess, MD    Allergies  Allergen Reactions  . Bisphosphonates     Past Surgical History:  Procedure Laterality Date  . MASTECTOMY MODIFIED RADICAL Right 1998   XRT/Chemo + 5 yrs  Aromasin  . OOPHORECTOMY Left     Social History  Substance Use Topics  . Smoking status: Never Smoker  . Smokeless tobacco: Never Used  . Alcohol use 1.2 oz/week    2 Standard drinks or equivalent per week     Comment: occasional     Medication list has been reviewed and updated.  PHQ 2/9 Scores 10/04/2016 05/15/2016 11/16/2015  PHQ - 2 Score 0 0 0    Physical Exam  Constitutional: She appears well-developed and well-nourished.  Cardiovascular: Normal rate, regular rhythm and normal heart sounds.   Pulmonary/Chest: Effort normal and breath sounds normal. No respiratory distress.  Abdominal: Soft. Bowel sounds are normal. There is tenderness in the suprapubic area. There is no rebound, no guarding and no CVA tenderness.  Psychiatric: She has a normal mood and affect.  Nursing note and vitals reviewed.  Urine dipstick shows positive for WBC's and positive for RBC's.  Micro exam: not done.  BP 110/68   Pulse 67   Ht 5' 7.5" (1.715 m)   Wt 138 lb 12.8 oz (63 kg)   SpO2 99%   BMI 21.42 kg/m   Assessment and Plan: 1. Acute cystitis with hematuria Continue increased fluids - POCT urinalysis dipstick - ciprofloxacin (CIPRO) 250 MG tablet; Take 1 tablet (250 mg total) by mouth 2 (two) times daily.  Dispense: 10 tablet; Refill: 0   Meds ordered this encounter  Medications  . ciprofloxacin (CIPRO) 250 MG tablet    Sig: Take 1 tablet  (250 mg total) by mouth 2 (two) times daily.    Dispense:  10 tablet    Refill:  0    Partially dictated using Editor, commissioning. Any errors are unintentional.  Halina Maidens, MD [facility]  07/16/2017

## 2017-08-20 ENCOUNTER — Ambulatory Visit: Payer: Medicare Other | Admitting: Internal Medicine

## 2017-08-21 ENCOUNTER — Encounter: Payer: Self-pay | Admitting: Internal Medicine

## 2017-08-21 ENCOUNTER — Ambulatory Visit (INDEPENDENT_AMBULATORY_CARE_PROVIDER_SITE_OTHER): Payer: Medicare Other | Admitting: Internal Medicine

## 2017-08-21 VITALS — BP 118/68 | HR 79 | Ht 67.5 in | Wt 141.0 lb

## 2017-08-21 DIAGNOSIS — R300 Dysuria: Secondary | ICD-10-CM

## 2017-08-21 DIAGNOSIS — B009 Herpesviral infection, unspecified: Secondary | ICD-10-CM

## 2017-08-21 DIAGNOSIS — Z23 Encounter for immunization: Secondary | ICD-10-CM

## 2017-08-21 LAB — POC URINALYSIS WITH MICROSCOPIC (NON AUTO)MANUAL RESULT
BACTERIA UA: 0
BILIRUBIN UA: NEGATIVE
CRYSTALS: 0
Epithelial cells, urine per micros: 0
GLUCOSE UA: NEGATIVE
Ketones, UA: NEGATIVE
Mucus, UA: 0
Nitrite, UA: NEGATIVE
Protein, UA: NEGATIVE
RBC: 1 M/uL — AB (ref 4.04–5.48)
Spec Grav, UA: 1.025 (ref 1.010–1.025)
UROBILINOGEN UA: 0.2 U/dL
WBC Casts, UA: 0
pH, UA: 6 (ref 5.0–8.0)

## 2017-08-21 MED ORDER — ZOSTER VAC RECOMB ADJUVANTED 50 MCG/0.5ML IM SUSR: 0.5000 mL | Freq: Once | INTRAMUSCULAR | 1 refills | Status: AC

## 2017-08-21 MED ORDER — VALACYCLOVIR HCL 1 G PO TABS: 2000.0000 mg | ORAL_TABLET | Freq: Two times a day (BID) | ORAL | 0 refills | Status: DC

## 2017-08-21 NOTE — Progress Notes (Signed)
Date:  08/21/2017   Name:  Judy Sampson   DOB:  02/24/1946   MRN:  [id number]   Chief Complaint: Urinary Tract Infection (Still having symptoms. Not constant but there is a "twinge" when finishing urine. ) Urinary Tract Infection   This is a recurrent problem. The problem occurs every urination. Associated symptoms include urgency. Pertinent negatives include no chills, frequency, hematuria, nausea or vomiting. Treatments tried: Cipro last month.   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  Treats it with L-lysine.  Now she wonders if could have genital herpes. She has mild discomfort at times usually random and not associated with urination.   Review of Systems  Constitutional: Negative for chills, fatigue and fever.  HENT: Positive for mouth sores (intermittent oral herpes).   Respiratory: Negative for chest tightness and shortness of breath.   Cardiovascular: Negative for chest pain.  Gastrointestinal: Negative for diarrhea, nausea and vomiting.  Endocrine: Negative for polydipsia.  Genitourinary: Positive for urgency. Negative for dysuria, frequency, genital sores and hematuria.    Patient Active Problem List   Diagnosis Date Noted  . Adenomatous polyp of colon 07/03/2017  . Foot pain, left 07/03/2017  . Age-related osteoporosis without current pathological fracture 10/04/2016  . Muscle spasms of neck 10/04/2016  . Bone disease 10/04/2016  . Cardiac arrhythmia 10/04/2016  . 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 Claustrophobia 05/26/2015  . Acid reflux 05/26/2015  . Personal history of malignant neoplasm of breast 10/30/1996    Prior to Admission medications   Medication Sig Start Date End Date Taking? Authorizing Provider  ciprofloxacin (CIPRO) 250 MG tablet Take 1 tablet (250 mg total) by mouth 2 (two) times daily. 07/16/17   Glean Hess, MD  clonazePAM (KLONOPIN) 0.5 MG tablet Take 0.5 tablets  (0.25 mg total) by mouth 2 (two) times daily as needed for anxiety. 07/03/17   Glean Hess, MD  Flaxseed, Linseed, (FLAX SEED OIL) 1000 MG CAPS Take by mouth.    [provider]  MULTIPLE VITAMIN PO Take by mouth.    [provider]  Omega-3 Fatty Acids (FISH OIL BURP-LESS) 1000 MG CAPS Take by mouth.    [provider]  Probiotic CAPS Take by mouth.    [provider]  sertraline (ZOLOFT) 50 MG tablet TAKE 1 TABLET(50 MG) BY MOUTH DAILY 03/07/17   Glean Hess, MD    Allergies  Allergen Reactions  . Bisphosphonates     Past Surgical History:  Procedure Laterality Date  . MASTECTOMY MODIFIED RADICAL Right 1998   XRT/Chemo + 5 yrs  Aromasin  . OOPHORECTOMY Left     Social History  Substance Use Topics  . Smoking status: Never Smoker  . Smokeless tobacco: Never Used  . Alcohol use 1.2 oz/week    2 Standard drinks or equivalent per week     Comment: occasional     Medication list has been reviewed and updated.  PHQ 2/9 Scores 10/04/2016 05/15/2016 11/16/2015  PHQ - 2 Score 0 0 0    Physical Exam  Constitutional: She is oriented to person, place, and time. She appears well-developed. No distress.  HENT:  Head: Normocephalic and atraumatic.  Cardiovascular: Normal rate, regular rhythm and normal heart sounds.   Pulmonary/Chest: Effort normal and breath sounds normal. No respiratory distress.  Genitourinary:  Genitourinary Comments: GU exam deferred due to no sx for past few days  Musculoskeletal: Normal range of motion.  Neurological: She is alert and oriented to person, place, and time.  Skin: Skin is warm and dry. No rash noted.  Psychiatric: She has a normal mood and affect. Her behavior is normal. Thought content normal.  Nursing note and vitals reviewed.   BP 118/68   Pulse 79   Ht 5' 7.5" (1.715 m)   Wt 141 lb (64 kg)   SpO2 100%   BMI 21.76 kg/m   Assessment and Plan: 1. Dysuria UA is completely clear - POC  urinalysis w microscopic (non auto)  2. Alphaherpesviral disease Trial of valtrex 2000 mg bid x 1 day for next episode of either oral or genital sx - valACYclovir (VALTREX) 1000 MG tablet; Take 2 tablets (2,000 mg total) by mouth 2 (two) times daily.  Dispense: 30 tablet; Refill: 0  3. Need for shingles vaccine - Zoster Vaccine Adjuvanted [facility] injection; Inject 0.5 mLs into the muscle once.  Dispense: 0.5 mL; Refill: 1   Meds ordered this encounter  Medications  . valACYclovir (VALTREX) 1000 MG tablet    Sig: Take 2 tablets (2,000 mg total) by mouth 2 (two) times daily.    Dispense:  30 tablet    Refill:  0  . Zoster Vaccine Adjuvanted [facility]) injection    Sig: Inject 0.5 mLs into the muscle once.    Dispense:  0.5 mL    Refill:  1    Partially dictated using Editor, commissioning. Any errors are unintentional.  Halina Maidens, MD [facility]  08/21/2017

## 2017-08-24 ENCOUNTER — Ambulatory Visit: Payer: Medicare Other | Admitting: Internal Medicine

## 2017-11-12 ENCOUNTER — Ambulatory Visit (INDEPENDENT_AMBULATORY_CARE_PROVIDER_SITE_OTHER): Payer: Medicare Other | Admitting: Internal Medicine

## 2017-11-12 ENCOUNTER — Ambulatory Visit: Payer: Medicare Other

## 2017-11-12 ENCOUNTER — Encounter: Payer: Self-pay | Admitting: Internal Medicine

## 2017-11-12 ENCOUNTER — Ambulatory Visit: Payer: Medicare Other | Admitting: Internal Medicine

## 2017-11-12 VITALS — BP 120/68 | HR 80 | Ht 67.5 in | Wt 141.0 lb

## 2017-11-12 DIAGNOSIS — Z0000 Encounter for general adult medical examination without abnormal findings: Secondary | ICD-10-CM | POA: Diagnosis not present

## 2017-11-12 DIAGNOSIS — Z853 Personal history of malignant neoplasm of breast: Secondary | ICD-10-CM

## 2017-11-12 DIAGNOSIS — F064 Anxiety disorder due to known physiological condition: Secondary | ICD-10-CM | POA: Diagnosis not present

## 2017-11-12 DIAGNOSIS — D126 Benign neoplasm of colon, unspecified: Secondary | ICD-10-CM

## 2017-11-12 DIAGNOSIS — B009 Herpesviral infection, unspecified: Secondary | ICD-10-CM

## 2017-11-12 LAB — POCT URINALYSIS DIPSTICK
Bilirubin, UA: NEGATIVE
GLUCOSE UA: NEGATIVE
KETONES UA: NEGATIVE
Leukocytes, UA: NEGATIVE
Nitrite, UA: NEGATIVE
Protein, UA: NEGATIVE
Spec Grav, UA: 1.015 (ref 1.010–1.025)
Urobilinogen, UA: 0.2 E.U./dL
pH, UA: 5 (ref 5.0–8.0)

## 2017-11-12 MED ORDER — SERTRALINE HCL 50 MG PO TABS: ORAL_TABLET | ORAL | 3 refills | Status: DC

## 2017-11-12 NOTE — Progress Notes (Signed)
Date:  11/12/2017   Name:  Judy Sampson   DOB:  06/28/1946   MRN:  [id number]   Chief Complaint: Annual Exam (OBGYN does pap and breast. ) Judy Sampson is a 72 y.o. female who presents today for her Complete Annual Exam. She feels well. She reports exercising. She reports she is sleeping well. She recently saw GYN for pelvic exam.  She will see Oncology in the near future for follow up of breast cancer. Colonoscopy is scheduled.  Anxiety  Presents for follow-up visit. Patient reports no chest pain, dizziness, nervous/anxious behavior, palpitations or shortness of breath. Primary symptoms comment: claustrophobia when flying. The severity of symptoms is moderate. The quality of sleep is good.   Compliance with medications is 76-100% (zoloft daily and clonazepam PRN).  HSV - taking L-lycine daily and Valtrex as needed.  Last episode was several months ago.    Review of Systems  Constitutional: Negative for chills, fatigue and fever.  HENT: Negative for congestion, hearing loss, tinnitus, trouble swallowing and voice change.   Eyes: Negative for visual disturbance.  Respiratory: Negative for cough, chest tightness, shortness of breath and wheezing.   Cardiovascular: Negative for chest pain, palpitations and leg swelling.  Gastrointestinal: Negative for abdominal pain, constipation, diarrhea and vomiting.  Endocrine: Negative for polydipsia and polyuria.  Genitourinary: Negative for dysuria, frequency, genital sores, vaginal bleeding and vaginal discharge.  Musculoskeletal: Negative for arthralgias, gait problem and joint swelling.  Skin: Negative for color change and rash.  Neurological: Negative for dizziness, tremors, light-headedness and headaches.  Hematological: Negative for adenopathy. Does not bruise/bleed easily.  Psychiatric/Behavioral: Negative for dysphoric mood and sleep disturbance. The patient is not nervous/anxious.     Patient Active Problem List   Diagnosis Date  Noted  . Adenomatous polyp of colon 07/03/2017  . Foot pain, left 07/03/2017  . Age-related osteoporosis without current pathological fracture 10/04/2016  . Muscle spasms of neck 10/04/2016  . Bone disease 10/04/2016  . Cardiac arrhythmia 10/04/2016  . 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 Claustrophobia 05/26/2015  . Acid reflux 05/26/2015  . Personal history of malignant neoplasm of breast 10/30/1996    Prior to Admission medications   Medication Sig Start Date End Date Taking? Authorizing Provider  Flaxseed, Linseed, (FLAX SEED OIL) 1000 MG CAPS Take by mouth.   Yes [provider]  MULTIPLE VITAMIN PO Take by mouth.   Yes [provider]  Omega-3 Fatty Acids (FISH OIL BURP-LESS) 1000 MG CAPS Take by mouth.   Yes [provider]  Probiotic CAPS Take by mouth.   Yes [provider]  sertraline (ZOLOFT) 50 MG tablet TAKE 1 TABLET(50 MG) BY MOUTH DAILY 03/07/17  Yes Glean Hess, MD  valACYclovir (VALTREX) 1000 MG tablet Take 2 tablets (2,000 mg total) by mouth 2 (two) times daily. 08/21/17  Yes Glean Hess, MD  clonazePAM (KLONOPIN) 0.5 MG tablet Take 0.5 tablets (0.25 mg total) by mouth 2 (two) times daily as needed for anxiety. Patient not taking: Reported on 11/12/2017 07/03/17   Glean Hess, MD    Allergies  Allergen Reactions  . Bisphosphonates     Past Surgical History:  Procedure Laterality Date  . MASTECTOMY MODIFIED RADICAL Right 1998   XRT/Chemo + 5 yrs  Aromasin  . OOPHORECTOMY Left     Social History   Tobacco Use  . Smoking status: Never Smoker  . Smokeless tobacco: Never Used  Substance  Use Topics  . Alcohol use: Yes    Alcohol/week: 1.2 oz    Types: 2 Standard drinks or equivalent per week    Comment: occasional  . Drug use: No     Medication list has been reviewed and updated.  PHQ 2/9 Scores 10/04/2016 05/15/2016 11/16/2015  PHQ - 2  Score 0 0 0    Physical Exam  Constitutional: She is oriented to person, place, and time. She appears well-developed and well-nourished. No distress.  HENT:  Head: Normocephalic and atraumatic.  Right Ear: Tympanic membrane and ear canal normal.  Left Ear: Tympanic membrane and ear canal normal.  Nose: Right sinus exhibits no maxillary sinus tenderness. Left sinus exhibits no maxillary sinus tenderness.  Mouth/Throat: Uvula is midline and oropharynx is clear and moist.  Eyes: Conjunctivae and EOM are normal. Right eye exhibits no discharge. Left eye exhibits no discharge. No scleral icterus.  Neck: Normal range of motion. Carotid bruit is not present. No erythema present. No thyromegaly present.  Cardiovascular: Normal rate, regular rhythm, normal heart sounds and normal pulses.  Pulmonary/Chest: Effort normal. No respiratory distress. She has no wheezes.  Abdominal: Soft. Bowel sounds are normal. There is no hepatosplenomegaly. There is no tenderness. There is no CVA tenderness.  Musculoskeletal: Normal range of motion.  Lymphadenopathy:    She has no cervical adenopathy.    She has no axillary adenopathy.  Neurological: She is alert and oriented to person, place, and time. She has normal reflexes. No cranial nerve deficit or sensory deficit.  Skin: Skin is warm, dry and intact. No rash noted.  Psychiatric: She has a normal mood and affect. Her speech is normal and behavior is normal. Thought content normal.  Nursing note and vitals reviewed.   BP 120/68   Pulse 80   Ht 5' 7.5" (1.715 m)   Wt 141 lb (64 kg)   BMI 21.76 kg/m   Assessment and Plan: 1. Annual physical exam - Lipid panel - POCT urinalysis dipstick  2. Anxiety disorder due to known physiological condition Continue prn clonazepam - sertraline (ZOLOFT) 50 MG tablet; TAKE 1 TABLET(50 MG) BY MOUTH DAILY  Dispense: 90 tablet; Refill: 3  3. Personal history of malignant neoplasm of breast To see Oncology - CBC with  Differential/Platelet - VITAMIN D 25 Hydroxy (Vit-D Deficiency, Fractures) - Comprehensive metabolic panel - Magnesium - Uric acid - Lactate Dehydrogenase (LDH) - Phosphorus - TSH  4. Alphaherpesviral disease Continue L-Lycine and prn valtrex  5. Adenomatous polyp of colon, unspecified part of colon Repeat colonoscopy scheduled   Meds ordered this encounter  Medications  . sertraline (ZOLOFT) 50 MG tablet    Sig: TAKE 1 TABLET(50 MG) BY MOUTH DAILY    Dispense:  90 tablet    Refill:  3    Partially dictated using Editor, commissioning. Any errors are unintentional.  Halina Maidens, MD [facility]  11/12/2017

## 2017-11-21 ENCOUNTER — Ambulatory Visit (INDEPENDENT_AMBULATORY_CARE_PROVIDER_SITE_OTHER): Payer: Medicare Other

## 2017-11-21 VITALS — BP 106/68 | HR 68 | Temp 97.8°F | Resp 12 | Ht 68.0 in | Wt 140.2 lb

## 2017-11-21 DIAGNOSIS — Z0000 Encounter for general adult medical examination without abnormal findings: Secondary | ICD-10-CM

## 2017-11-21 NOTE — Patient Instructions (Signed)
Judy Sampson , Thank you for taking time to come for your Medicare Wellness Visit. I appreciate your ongoing commitment to your health goals. Please review the following plan we discussed and let me know if I can assist you in the future.   Screening recommendations/referrals: Colonoscopy: Completed 11/28/13. Repeat every 3 years. Mammogram: Completed 11/01/17. Repeat every year. Bone Density: Completed 11/19/13. Osteoporotic screenings no longer required Recommended yearly ophthalmology/optometry visit for glaucoma screening and checkup Recommended yearly dental visit for hygiene and checkup  Vaccinations: Influenza vaccine: Up to date Pneumococcal vaccine: Completed series Tdap vaccine: Up to date Shingles vaccine: Up to date. Please call your insurance company to determine your out of pocket expense for Shingrix. You may also receive this vaccine at your local pharmacy or Health Dept.  Advanced directives: Advance directive discussed with you today. I have provided a copy for you to complete at home and have notarized. Once this is complete please bring a copy in to our office so we can scan it into your chart.  Conditions/risks identified: Recommend to drink at least 6-8 8oz glasses of water per Sampson.   Next appointment: Please schedule your Annual Wellness Visit with your Nurse Health Advisor in one year.  Preventive Care 24 Years and Older, Female Preventive care refers to lifestyle choices and visits with your health care provider that can promote health and wellness. What does preventive care include?  A yearly physical exam. This is also called an annual well check.  Dental exams once or twice a year.  Routine eye exams. Ask your health care provider how often you should have your eyes checked.  Personal lifestyle choices, including:  Daily care of your teeth and gums.  Regular physical activity.  Eating a healthy diet.  Avoiding tobacco and drug use.  Limiting alcohol  use.  Practicing safe sex.  Taking low-dose aspirin every Sampson.  Taking vitamin and mineral supplements as recommended by your health care provider. What happens during an annual well check? The services and screenings done by your health care provider during your annual well check will depend on your age, overall health, lifestyle risk factors, and family history of disease. Counseling  Your health care provider may ask you questions about your:  Alcohol use.  Tobacco use.  Drug use.  Emotional well-being.  Home and relationship well-being.  Sexual activity.  Eating habits.  History of falls.  Memory and ability to understand (cognition).  Work and work Statistician.  Reproductive health. Screening  You may have the following tests or measurements:  Height, weight, and BMI.  Blood pressure.  Lipid and cholesterol levels. These may be checked every 5 years, or more frequently if you are over 51 years old.  Skin check.  Lung cancer screening. You may have this screening every year starting at age 75 if you have a 30-pack-year history of smoking and currently smoke or have quit within the past 15 years.  Fecal occult blood test (FOBT) of the stool. You may have this test every year starting at age 68.  Flexible sigmoidoscopy or colonoscopy. You may have a sigmoidoscopy every 5 years or a colonoscopy every 10 years starting at age 96.  Hepatitis C blood test.  Hepatitis B blood test.  Sexually transmitted disease (STD) testing.  Diabetes screening. This is done by checking your blood sugar (glucose) after you have not eaten for a while (fasting). You may have this done every 1-3 years.  Bone density scan. This is done to  screen for osteoporosis. You may have this done starting at age 46.  Mammogram. This may be done every 1-2 years. Talk to your health care provider about how often you should have regular mammograms. Talk with your health care provider about  your test results, treatment options, and if necessary, the need for more tests. Vaccines  Your health care provider may recommend certain vaccines, such as:  Influenza vaccine. This is recommended every year.  Tetanus, diphtheria, and acellular pertussis (Tdap, Td) vaccine. You may need a Td booster every 10 years.  Zoster vaccine. You may need this after age 57.  Pneumococcal 13-valent conjugate (PCV13) vaccine. One dose is recommended after age 78.  Pneumococcal polysaccharide (PPSV23) vaccine. One dose is recommended after age 83. Talk to your health care provider about which screenings and vaccines you need and how often you need them. This information is not intended to replace advice given to you by your health care provider. Make sure you discuss any questions you have with your health care provider. Document Released: 11/12/2015 Document Revised: 07/05/2016 Document Reviewed: 08/17/2015 Elsevier Interactive Patient Education  2017 [employer] Prevention in the Home Falls can cause injuries. They can happen to people of all ages. There are many things you can do to make your home safe and to help prevent falls. What can I do on the outside of my home?  Regularly fix the edges of walkways and driveways and fix any cracks.  Remove anything that might make you trip as you walk through a door, such as a raised step or threshold.  Trim any bushes or trees on the path to your home.  Use bright outdoor lighting.  Clear any walking paths of anything that might make someone trip, such as rocks or tools.  Regularly check to see if handrails are loose or broken. Make sure that both sides of any steps have handrails.  Any raised decks and porches should have guardrails on the edges.  Have any leaves, snow, or ice cleared regularly.  Use sand or salt on walking paths during winter.  Clean up any spills in your garage right away. This includes oil or grease spills. What  can I do in the bathroom?  Use night lights.  Install grab bars by the toilet and in the tub and shower. Do not use towel bars as grab bars.  Use non-skid mats or decals in the tub or shower.  If you need to sit down in the shower, use a plastic, non-slip stool.  Keep the floor dry. Clean up any water that spills on the floor as soon as it happens.  Remove soap buildup in the tub or shower regularly.  Attach bath mats securely with double-sided non-slip rug tape.  Do not have throw rugs and other things on the floor that can make you trip. What can I do in the bedroom?  Use night lights.  Make sure that you have a light by your bed that is easy to reach.  Do not use any sheets or blankets that are too big for your bed. They should not hang down onto the floor.  Have a firm chair that has side arms. You can use this for support while you get dressed.  Do not have throw rugs and other things on the floor that can make you trip. What can I do in the kitchen?  Clean up any spills right away.  Avoid walking on wet floors.  Keep items that  you use a lot in easy-to-reach places.  If you need to reach something above you, use a strong step stool that has a grab bar.  Keep electrical cords out of the way.  Do not use floor polish or wax that makes floors slippery. If you must use wax, use non-skid floor wax.  Do not have throw rugs and other things on the floor that can make you trip. What can I do with my stairs?  Do not leave any items on the stairs.  Make sure that there are handrails on both sides of the stairs and use them. Fix handrails that are broken or loose. Make sure that handrails are as long as the stairways.  Check any carpeting to make sure that it is firmly attached to the stairs. Fix any carpet that is loose or worn.  Avoid having throw rugs at the top or bottom of the stairs. If you do have throw rugs, attach them to the floor with carpet tape.  Make sure  that you have a light switch at the top of the stairs and the bottom of the stairs. If you do not have them, ask someone to add them for you. What else can I do to help prevent falls?  Wear shoes that:  Do not have high heels.  Have rubber bottoms.  Are comfortable and fit you well.  Are closed at the toe. Do not wear sandals.  If you use a stepladder:  Make sure that it is fully opened. Do not climb a closed stepladder.  Make sure that both sides of the stepladder are locked into place.  Ask someone to hold it for you, if possible.  Clearly mark and make sure that you can see:  Any grab bars or handrails.  First and last steps.  Where the edge of each step is.  Use tools that help you move around (mobility aids) if they are needed. These include:  Canes.  Walkers.  Scooters.  Crutches.  Turn on the lights when you go into a dark area. Replace any light bulbs as soon as they burn out.  Set up your furniture so you have a clear path. Avoid moving your furniture around.  If any of your floors are uneven, fix them.  If there are any pets around you, be aware of where they are.  Review your medicines with your doctor. Some medicines can make you feel dizzy. This can increase your chance of falling. Ask your doctor what other things that you can do to help prevent falls. This information is not intended to replace advice given to you by your health care provider. Make sure you discuss any questions you have with your health care provider. Document Released: 08/12/2009 Document Revised: 03/23/2016 Document Reviewed: 11/20/2014 Elsevier Interactive Patient Education  2017 [employer].

## 2017-11-21 NOTE — Progress Notes (Signed)
Subjective:   Judy Sampson is a 72 y.o. female who presents for Medicare Annual (Subsequent) preventive examination.  Review of Systems:  N/A Cardiac Risk Factors include: advanced age (>74men, >27 women)     Objective:     Vitals: Ht 5\' 8"  (1.727 m)   Wt 140 lb 3.2 oz (63.6 kg)   BMI 21.32 kg/m   Body mass index is 21.32 kg/m.  Advanced Directives 11/21/2017 10/04/2016 11/16/2015  Does Patient Have a Medical Advance Directive? No No No  Would patient like information on creating a medical advance directive? Yes (MAU/Ambulatory/Procedural Areas - Information given) - No - patient declined information    Tobacco Social History   Tobacco Use  Smoking Status Never Smoker  Smokeless Tobacco Never Used  Tobacco Comment   smoking cessation materials not required     Counseling given: No Comment: smoking cessation materials not required   Clinical Intake:  Pre-visit preparation completed: Yes  Pain : No/denies pain   BMI - recorded: 21.32 Nutritional Status: BMI of 19-24  Normal Nutritional Risks: None Diabetes: No  How often do you need to have someone help you when you read instructions, pamphlets, or other written materials from your doctor or pharmacy?: 1 - Never  Interpreter Needed?: No  Information entered by :: AEverdsole, LPN  Past Medical History:  Diagnosis Date  . Anxiety   . GERD (gastroesophageal reflux disease)    Past Surgical History:  Procedure Laterality Date  . MASTECTOMY MODIFIED RADICAL Right 1998   XRT/Chemo + 5 yrs  Aromasin  . OOPHORECTOMY Left    Family History  Problem Relation Age of Onset  . Diabetes Mother   . Parkinson's disease Mother   . Heart disease Father   . Parkinson's disease Brother    Social History   Socioeconomic History  . Marital status: Divorced    Spouse name: None  . Number of children: 0  . Years of education: None  . Highest education level: Master's degree (e.g., MA, MS, MEng, MEd, MSW, MBA)    Social Needs  . Financial resource strain: Not hard at all  . Food insecurity - worry: Never true  . Food insecurity - inability: Never true  . Transportation needs - medical: No  . Transportation needs - non-medical: No  Occupational History  . Occupation: Retired  Tobacco Use  . Smoking status: Never Smoker  . Smokeless tobacco: Never Used  . Tobacco comment: smoking cessation materials not required  Substance and Sexual Activity  . Alcohol use: Yes    Alcohol/week: 1.2 oz    Types: 2 Standard drinks or equivalent per week    Comment: occasional  . Drug use: No  . Sexual activity: Not Currently  Other Topics Concern  . None  Social History Narrative  . None    Outpatient Encounter Medications as of 11/21/2017  Medication Sig  . Calcium-Magnesium-Vitamin D (CALCIUM MAGNESIUM PO) Take 1 tablet by mouth daily.  . Flaxseed, Linseed, (FLAX SEED OIL) 1000 MG CAPS Take by mouth.  . MULTIPLE VITAMIN PO Take by mouth.  . Omega-3 Fatty Acids (FISH OIL BURP-LESS) 1000 MG CAPS Take by mouth.  . Probiotic CAPS Take by mouth.  . sertraline (ZOLOFT) 50 MG tablet TAKE 1 TABLET(50 MG) BY MOUTH DAILY  . TURMERIC PO Take 1 tablet by mouth daily.  . valACYclovir (VALTREX) 1000 MG tablet Take 2 tablets (2,000 mg total) by mouth 2 (two) times daily.  . clonazePAM (KLONOPIN) 0.5 MG tablet Take  0.5 tablets (0.25 mg total) by mouth 2 (two) times daily as needed for anxiety. (Patient not taking: Reported on 11/12/2017)   No facility-administered encounter medications on file as of 11/21/2017.     Activities of Daily Living In your present state of health, do you have any difficulty performing the following activities: 11/21/2017  Hearing? Y  Comment tinitis; denies wearing hearing aids  Vision? N  Comment wears eyeglasses  Difficulty concentrating or making decisions? N  Walking or climbing stairs? N  Dressing or bathing? N  Doing errands, shopping? N  Preparing Food and eating ? N  Comment  denies wearing dentures  Using the Toilet? N  In the past six months, have you accidently leaked urine? Y  Comment stress incontinence; wears pads  Do you have problems with loss of bowel control? N  Managing your Medications? N  Managing your Finances? N  Housekeeping or managing your Housekeeping? N  Some recent data might be hidden    Patient Care Team: Glean Hess, MD as PCP - General (Family Medicine) Dr. Enos Fling (Obstetrics and Gynecology) Laveda Norman, MD as Consulting Physician (Hematology and Oncology)    Assessment:   This is a routine wellness examination for Tylan.  Exercise Activities and Dietary recommendations Current Exercise Habits: Structured exercise class, Type of exercise: strength training/weights;yoga;walking, Time (Minutes): 45, Frequency (Times/Week): 7, Weekly Exercise (Minutes/Week): 315, Intensity: Mild, Exercise limited by: None identified  Goals    . DIET - INCREASE WATER INTAKE     Recommend to drink at least 6-8 8oz glasses of water per day.        Fall Risk Fall Risk  11/21/2017 10/04/2016 05/15/2016 11/16/2015  Falls in the past year? No No No No   Is the patient's home free of loose throw rugs in walkways, pet beds, electrical cords, etc?   Yes Does the patient have any grab bars in the bathroom? No  Does the patient use a shower chair when bathing? No Does the patient have any stairs in or around the home? Yes If so, are there any handrails?  Yes Does the patient have adequate lighting?  Yes Does the patient use a cane, walker or w/c? No Does the patient use of an elevated toilet seat? No  Timed Get Up and Go Performed: Yes. Pt ambulated 10 feet within 7 sec. Gait stead-fast and without the use of an assistive device. No intervention required at this time. Fall risk prevention has been discussed.  Depression Screen PHQ 2/9 Scores 11/21/2017 10/04/2016 05/15/2016 11/16/2015  PHQ - 2 Score 0 0 0 0     Cognitive Function     6CIT  Screen 11/21/2017 10/04/2016  What Year? 0 points 0 points  What month? 0 points 0 points  What time? 0 points 0 points  Count back from 20 0 points 0 points  Months in reverse 0 points 0 points  Repeat phrase 0 points 0 points  Total Score 0 0    Immunization History  Administered Date(s) Administered  . Influenza-Unspecified 07/30/2014, 07/01/2015, 08/21/2017  . Pneumococcal Conjugate-13 11/19/2014  . Pneumococcal Polysaccharide-23 11/16/2015  . Tdap 08/31/2015  . Zoster 11/01/2007    Qualifies for Shingles Vaccine? Yes. Completed Zostavax 11/01/07. Due for Shingrix vaccine. Education has been provided regarding the importance of this vaccine. Pt has been advised to call her insurance company to determine her out of pocket expense. Advised she may also receive this vaccine at her local pharmacy or Health Dept. Verbalized  acceptance and understanding.  Screening Tests Health Maintenance  Topic Date Due  . COLONOSCOPY  11/28/2016  . MAMMOGRAM  11/01/2018  . TETANUS/TDAP  08/30/2025  . INFLUENZA VACCINE  Completed  . Hepatitis C Screening  Completed  . PNA vac Low Risk Adult  Completed  . DEXA SCAN  Addressed    Cancer Screenings: Lung: Low Dose CT Chest recommended if Age 68-80 years, 30 pack-year currently smoking OR have quit w/in 15years. Patient does not qualify. NON-SMOKER Breast:  Up to date on Mammogram? Yes. Completed 11/01/17. Repeat every year. Up to date of Bone Density/Dexa? Yes. Completed 11/19/13. Osteoporotic screenings no longer required Colorectal: Colonoscopy completed 11/28/13. Repeat every 3 years. Scheduled for repeat colorectal screening on 12/24/17.  Additional Screenings: Hepatitis B/HIV/Syphillis: Does not qualify Hepatitis C Screening: Completed 10/04/16     Plan:  I have personally reviewed and addressed the Medicare Annual Wellness questionnaire and have noted the following in the patient's chart:  A. Medical and social history B. Use of alcohol,  tobacco or illicit drugs  C. Current medications and supplements D. Functional ability and status E.  Nutritional status F.  Physical activity G. Advance directives H. List of other physicians I.  Hospitalizations, surgeries, and ER visits in previous 12 months J.  [place] such as hearing and vision if needed, cognitive and depression L. Referrals and appointments - none  In addition, I have reviewed and discussed with patient certain preventive protocols, quality metrics, and best practice recommendations. A written personalized care plan for preventive services as well as general preventive health recommendations were provided to patient.  Signed,  Aleatha Borer, LPN Nurse Health Advisor  MD Recommendations: None

## 2017-11-22 LAB — COMPREHENSIVE METABOLIC PANEL
A/G RATIO: 1.6 (ref 1.2–2.2)
ALT: 15 IU/L (ref 0–32)
AST: 21 IU/L (ref 0–40)
Albumin: 4.2 g/dL (ref 3.6–4.8)
Alkaline Phosphatase: 68 IU/L (ref 39–117)
BILIRUBIN TOTAL: 0.4 mg/dL (ref 0.0–1.2)
BUN/Creatinine Ratio: 23 (ref 12–28)
BUN: 19 mg/dL (ref 8–27)
CALCIUM: 9.2 mg/dL (ref 8.7–10.3)
CO2: 24 mmol/L (ref 20–29)
Chloride: 99 mmol/L (ref 96–106)
Creatinine, Ser: 0.83 mg/dL (ref 0.57–1.00)
GFR calc Af Amer: 83 mL/min/{1.73_m2} (ref >59)
GFR, EST NON AFRICAN AMERICAN: 72 mL/min/{1.73_m2} (ref >59)
GLOBULIN, TOTAL: 2.6 g/dL (ref 1.5–4.5)
Glucose: 79 mg/dL (ref 65–99)
POTASSIUM: 4.7 mmol/L (ref 3.5–5.2)
SODIUM: 137 mmol/L (ref 134–144)
Total Protein: 6.8 g/dL (ref 6.0–8.5)

## 2017-11-22 LAB — LIPID PANEL
CHOL/HDL RATIO: 2.9 ratio (ref 0.0–4.4)
Cholesterol, Total: 231 mg/dL — ABNORMAL HIGH (ref 100–199)
HDL: 79 mg/dL (ref >39)
LDL CALC: 141 mg/dL — AB (ref 0–99)
Triglycerides: 55 mg/dL (ref 0–149)
VLDL Cholesterol Cal: 11 mg/dL (ref 5–40)

## 2017-11-22 LAB — CBC WITH DIFFERENTIAL/PLATELET
BASOS ABS: 0 10*3/uL (ref 0.0–0.2)
Basos: 1 %
EOS (ABSOLUTE): 0.1 10*3/uL (ref 0.0–0.4)
Eos: 3 %
Hematocrit: 41.3 % (ref 34.0–46.6)
Hemoglobin: 13.3 g/dL (ref 11.1–15.9)
IMMATURE GRANS (ABS): 0 10*3/uL (ref 0.0–0.1)
Immature Granulocytes: 0 %
LYMPHS: 43 %
Lymphocytes Absolute: 1.7 10*3/uL (ref 0.7–3.1)
MCH: 29.4 pg (ref 26.6–33.0)
MCHC: 32.2 g/dL (ref 31.5–35.7)
MCV: 91 fL (ref 79–97)
MONOS ABS: 0.3 10*3/uL (ref 0.1–0.9)
Monocytes: 8 %
NEUTROS ABS: 1.8 10*3/uL (ref 1.4–7.0)
NEUTROS PCT: 45 %
PLATELETS: 182 10*3/uL (ref 150–379)
RBC: 4.53 x10E6/uL (ref 3.77–5.28)
RDW: 14.1 % (ref 12.3–15.4)
WBC: 4 10*3/uL (ref 3.4–10.8)

## 2017-11-22 LAB — URIC ACID: URIC ACID: 4.5 mg/dL (ref 2.5–7.1)

## 2017-11-22 LAB — PHOSPHORUS: Phosphorus: 3.4 mg/dL (ref 2.5–4.5)

## 2017-11-22 LAB — VITAMIN D 25 HYDROXY (VIT D DEFICIENCY, FRACTURES): VIT D 25 HYDROXY: 29.3 ng/mL — AB (ref 30.0–100.0)

## 2017-11-22 LAB — TSH: TSH: 1.29 u[IU]/mL (ref 0.450–4.500)

## 2017-11-22 LAB — LACTATE DEHYDROGENASE: LDH: 175 IU/L (ref 119–226)

## 2017-11-22 LAB — MAGNESIUM: Magnesium: 2.1 mg/dL (ref 1.6–2.3)

## 2018-01-02 ENCOUNTER — Encounter: Payer: Medicare Other | Admitting: Internal Medicine

## 2018-07-22 ENCOUNTER — Telehealth: Payer: Self-pay

## 2018-07-22 NOTE — Telephone Encounter (Signed)
Patient has past RX for Lorazepam. She only uses it for flying when needed because she has claustrophobia. Her current RX is 70 years old. Wanted to know if she could have a updated version of this or does she need appt?  Please Advise.

## 2018-07-22 NOTE — Telephone Encounter (Signed)
She got #20 Clonazepam last September that was for travel anxiety.  Has she used all of those tablets?

## 2018-07-23 ENCOUNTER — Other Ambulatory Visit: Payer: Self-pay | Admitting: Internal Medicine

## 2018-07-23 MED ORDER — LORAZEPAM 0.5 MG PO TABS: 0.5000 mg | ORAL_TABLET | Freq: Three times a day (TID) | ORAL | 0 refills | Status: DC | PRN

## 2018-07-23 NOTE — Telephone Encounter (Signed)
[place]. Spoke with patient and she wants 5 tablets of Lorazepam for travel anxiety and panic attacks. Clonazepam is not as fast acting for her.

## 2018-07-23 NOTE — Telephone Encounter (Signed)
LVMTCB

## 2018-11-13 ENCOUNTER — Other Ambulatory Visit: Payer: Self-pay | Admitting: Internal Medicine

## 2018-11-13 ENCOUNTER — Encounter: Payer: Self-pay | Admitting: Internal Medicine

## 2018-11-13 DIAGNOSIS — F064 Anxiety disorder due to known physiological condition: Secondary | ICD-10-CM

## 2018-11-14 ENCOUNTER — Encounter: Payer: Self-pay | Admitting: Internal Medicine

## 2018-11-14 ENCOUNTER — Ambulatory Visit (INDEPENDENT_AMBULATORY_CARE_PROVIDER_SITE_OTHER): Payer: Medicare Other | Admitting: Internal Medicine

## 2018-11-14 VITALS — BP 112/68 | HR 68 | Ht 68.0 in | Wt 140.2 lb

## 2018-11-14 DIAGNOSIS — K219 Gastro-esophageal reflux disease without esophagitis: Secondary | ICD-10-CM

## 2018-11-14 DIAGNOSIS — Z0000 Encounter for general adult medical examination without abnormal findings: Secondary | ICD-10-CM | POA: Diagnosis not present

## 2018-11-14 DIAGNOSIS — Z8619 Personal history of other infectious and parasitic diseases: Secondary | ICD-10-CM

## 2018-11-14 DIAGNOSIS — Z853 Personal history of malignant neoplasm of breast: Secondary | ICD-10-CM

## 2018-11-14 DIAGNOSIS — M810 Age-related osteoporosis without current pathological fracture: Secondary | ICD-10-CM

## 2018-11-14 DIAGNOSIS — E785 Hyperlipidemia, unspecified: Secondary | ICD-10-CM | POA: Diagnosis not present

## 2018-11-14 LAB — POCT URINALYSIS DIPSTICK
Bilirubin, UA: NEGATIVE
Glucose, UA: NEGATIVE
KETONES UA: NEGATIVE
Nitrite, UA: NEGATIVE
Protein, UA: NEGATIVE
Spec Grav, UA: 1.01 (ref 1.010–1.025)
Urobilinogen, UA: 0.2 E.U./dL
pH, UA: 6 (ref 5.0–8.0)

## 2018-11-14 NOTE — Patient Instructions (Signed)
Health Maintenance  Topic Date Due  . MAMMOGRAM  11/01/2018  . COLONOSCOPY  12/24/2020  . TETANUS/TDAP  10/29/2028  . INFLUENZA VACCINE  Completed  . Hepatitis C Screening  Completed  . PNA vac Low Risk Adult  Completed  . DEXA SCAN  Addressed

## 2018-11-14 NOTE — Progress Notes (Signed)
Date:  11/14/2018   Name:  Judy Sampson   DOB:  [birth year]   MRN:  [id number]   Chief Complaint: Annual Exam Judy Sampson is a 73 y.o. female who presents today for her Complete Annual Exam. She feels well. She reports exercising regularly - yoga and cardio classes. She reports she is sleeping well.   Breast Cancer - she has follow up with oncology later this month and would like to include some special labs.  She is feeling well, no breast problems, fatigue, weight loss.  Oncologist orders mammograms.  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  Only gets outbreaks about every 2 years.  GERD - rarely has sx.  Osteoporosis - on calcium and vitamin D.  Last DEXA on file in 2015.  Monitored by oncology.  Lab Results  Component Value Date   CREATININE 0.83 11/21/2017   BUN 19 11/21/2017   NA 137 11/21/2017   K 4.7 11/21/2017   CL 99 11/21/2017   CO2 24 11/21/2017   Lab Results  Component Value Date   CHOL 231 (H) 11/21/2017   HDL 79 11/21/2017   LDLCALC 141 (H) 11/21/2017   TRIG 55 11/21/2017   CHOLHDL 2.9 11/21/2017    Review of Systems  Constitutional: Negative for chills, fatigue and fever.  HENT: Negative for congestion, hearing loss, tinnitus, trouble swallowing and voice change.   Eyes: Negative for visual disturbance.  Respiratory: Negative for cough, chest tightness, shortness of breath and wheezing.   Cardiovascular: Negative for chest pain, palpitations and leg swelling.  Gastrointestinal: Negative for abdominal pain, constipation, diarrhea and vomiting.  Endocrine: Negative for polydipsia and polyuria.  Genitourinary: Negative for dysuria, frequency, genital sores, vaginal bleeding and vaginal discharge.  Musculoskeletal: Negative for arthralgias, gait problem and joint swelling.  Skin: Negative for color change and rash.  Neurological: Negative for dizziness, tremors, light-headedness and headaches.    Hematological: Negative for adenopathy. Does not bruise/bleed easily.  Psychiatric/Behavioral: Negative for dysphoric mood and sleep disturbance. The patient is not nervous/anxious.     Patient Active Problem List   Diagnosis Date Noted  . Adenomatous polyp of colon 07/03/2017  . Foot pain, left 07/03/2017  . Age-related osteoporosis without current pathological fracture 10/04/2016  . Muscle spasms of neck 10/04/2016  . Bone disease 10/04/2016  . Cardiac arrhythmia 10/04/2016  . 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 Claustrophobia 05/26/2015  . Acid reflux 05/26/2015  . Personal history of malignant neoplasm of breast 10/30/1996    Allergies  Allergen Reactions  . Bisphosphonates     Past Surgical History:  Procedure Laterality Date  . MASTECTOMY MODIFIED RADICAL Right 1998   XRT/Chemo + 5 yrs  Aromasin  . OOPHORECTOMY Left     Social History   Tobacco Use  . Smoking status: Never Smoker  . Smokeless tobacco: Never Used  . Tobacco comment: smoking cessation materials not required  Substance Use Topics  . Alcohol use: Yes    Alcohol/week: 2.0 standard drinks    Types: 2 Standard drinks or equivalent per week    Comment: occasional  . Drug use: No     Medication list has been reviewed and updated.  Current Meds  Medication Sig  . Calcium-Magnesium-Vitamin D (CALCIUM MAGNESIUM PO) Take 1 tablet by mouth daily.  . Flaxseed, Linseed, (FLAX SEED OIL) 1000 MG CAPS Take by mouth.  Marland Kitchen LORazepam (  ATIVAN) 0.5 MG tablet Take 1 tablet (0.5 mg total) by mouth every 8 (eight) hours as needed for anxiety.  . MULTIPLE VITAMIN PO Take by mouth.  . Omega-3 Fatty Acids (FISH OIL BURP-LESS) 1000 MG CAPS Take by mouth.  . Probiotic CAPS Take by mouth.  . sertraline (ZOLOFT) 50 MG tablet TAKE 1 TABLET(50 MG) BY MOUTH DAILY  . TURMERIC PO Take 1 tablet by mouth daily.  . valACYclovir (VALTREX) 1000 MG tablet Take  2 tablets (2,000 mg total) by mouth 2 (two) times daily. (Patient taking differently: Take 2,000 mg by mouth 2 (two) times daily. PRN)    PHQ 2/9 Scores 11/14/2018 11/21/2017 10/04/2016 05/15/2016  PHQ - 2 Score 0 0 0 0    Physical Exam Vitals signs and nursing note reviewed.  Constitutional:      General: She is not in acute distress.    Appearance: She is well-developed.  HENT:     Head: Normocephalic and atraumatic.     Right Ear: Tympanic membrane and ear canal normal.     Left Ear: Tympanic membrane and ear canal normal.     Nose:     Right Sinus: No maxillary sinus tenderness.     Left Sinus: No maxillary sinus tenderness.     Mouth/Throat:     Pharynx: Uvula midline.  Eyes:     General: No scleral icterus.       Right eye: No discharge.        Left eye: No discharge.     Conjunctiva/sclera: Conjunctivae normal.  Neck:     Musculoskeletal: Normal range of motion. No erythema.     Thyroid: No thyromegaly.     Vascular: No carotid bruit.  Cardiovascular:     Rate and Rhythm: Normal rate and regular rhythm.     Pulses: Normal pulses.     Heart sounds: Normal heart sounds.  Pulmonary:     Effort: Pulmonary effort is normal. No respiratory distress.     Breath sounds: No wheezing.  Abdominal:     General: Bowel sounds are normal.     Palpations: Abdomen is soft.     Tenderness: There is no abdominal tenderness.  Musculoskeletal: Normal range of motion.  Lymphadenopathy:     Cervical: No cervical adenopathy.  Skin:    General: Skin is warm and dry.     Findings: No rash.  Neurological:     Mental Status: She is alert and oriented to person, place, and time.     Cranial Nerves: No cranial nerve deficit.     Sensory: No sensory deficit.     Deep Tendon Reflexes: Reflexes are normal and symmetric.  Psychiatric:        Speech: Speech normal.        Behavior: Behavior normal.        Thought Content: Thought content normal.    Wt Readings from Last 3 Encounters:   11/14/18 140 lb 3.2 oz (63.6 kg)  11/21/17 140 lb 3.2 oz (63.6 kg)  11/12/17 141 lb (64 kg)    BP 112/68   Pulse 68   Ht 5\' 8"  (1.727 m)   Wt 140 lb 3.2 oz (63.6 kg)   SpO2 97%   BMI 21.32 kg/m   Assessment and Plan: 1. Annual physical exam Normal exam Immunizations are up to date - TSH  2. Age-related osteoporosis without current pathological fracture Continue calcium and vitamin D - VITAMIN D 25 Hydroxy (Vit-D Deficiency, Fractures)  3. Gastroesophageal reflux  disease, esophagitis presence not specified Occasional mild sx - CBC with Differential/Platelet  4. Hx of herpes genitalis Continue Valtrex PRN - call for new Rx if needed  5. Mild hyperlipidemia Check labs - Lipid panel  6. Personal history of malignant neoplasm of breast Followed by Oncology - Comprehensive metabolic panel - Uric acid - Phosphorus - LDH Isoenzyme - Magnesium   Partially dictated using Editor, commissioning. Any errors are unintentional.  Halina Maidens, MD [facility]  11/14/2018

## 2018-11-21 LAB — CBC WITH DIFFERENTIAL/PLATELET
BASOS: 1 %
Basophils Absolute: 0 10*3/uL (ref 0.0–0.2)
EOS (ABSOLUTE): 0.1 10*3/uL (ref 0.0–0.4)
Eos: 2 %
Hematocrit: 39.2 % (ref 34.0–46.6)
Hemoglobin: 13 g/dL (ref 11.1–15.9)
IMMATURE GRANULOCYTES: 0 %
Immature Grans (Abs): 0 10*3/uL (ref 0.0–0.1)
Lymphocytes Absolute: 1.6 10*3/uL (ref 0.7–3.1)
Lymphs: 33 %
MCH: 29.4 pg (ref 26.6–33.0)
MCHC: 33.2 g/dL (ref 31.5–35.7)
MCV: 89 fL (ref 79–97)
MONOS ABS: 0.4 10*3/uL (ref 0.1–0.9)
Monocytes: 8 %
NEUTROS PCT: 56 %
Neutrophils Absolute: 2.6 10*3/uL (ref 1.4–7.0)
PLATELETS: 170 10*3/uL (ref 150–450)
RBC: 4.42 x10E6/uL (ref 3.77–5.28)
RDW: 12.6 % (ref 11.7–15.4)
WBC: 4.7 10*3/uL (ref 3.4–10.8)

## 2018-11-21 LAB — COMPREHENSIVE METABOLIC PANEL
A/G RATIO: 1.8 (ref 1.2–2.2)
ALK PHOS: 71 IU/L (ref 39–117)
ALT: 15 IU/L (ref 0–32)
AST: 22 IU/L (ref 0–40)
Albumin: 4.1 g/dL (ref 3.5–4.8)
BILIRUBIN TOTAL: 0.4 mg/dL (ref 0.0–1.2)
BUN/Creatinine Ratio: 20 (ref 12–28)
BUN: 18 mg/dL (ref 8–27)
CALCIUM: 9.1 mg/dL (ref 8.7–10.3)
CHLORIDE: 101 mmol/L (ref 96–106)
CO2: 25 mmol/L (ref 20–29)
Creatinine, Ser: 0.9 mg/dL (ref 0.57–1.00)
GFR calc Af Amer: 75 mL/min/{1.73_m2} (ref >59)
GFR calc non Af Amer: 65 mL/min/{1.73_m2} (ref >59)
Globulin, Total: 2.3 g/dL (ref 1.5–4.5)
Glucose: 88 mg/dL (ref 65–99)
POTASSIUM: 4.5 mmol/L (ref 3.5–5.2)
Sodium: 138 mmol/L (ref 134–144)
Total Protein: 6.4 g/dL (ref 6.0–8.5)

## 2018-11-21 LAB — LIPID PANEL
CHOLESTEROL TOTAL: 228 mg/dL — AB (ref 100–199)
Chol/HDL Ratio: 3 ratio (ref 0.0–4.4)
HDL: 76 mg/dL (ref >39)
LDL Calculated: 144 mg/dL — ABNORMAL HIGH (ref 0–99)
Triglycerides: 42 mg/dL (ref 0–149)
VLDL Cholesterol Cal: 8 mg/dL (ref 5–40)

## 2018-11-21 LAB — LACTATE DEHYDROGENASE, ISOENZYMES
(LD) FRACTION 1: 26 % (ref 17–32)
(LD) FRACTION 2: 32 % (ref 25–40)
(LD) Fraction 3: 23 % (ref 17–27)
(LD) Fraction 4: 9 % (ref 5–13)
(LD) Fraction 5: 10 % (ref 4–20)
LDH: 167 IU/L (ref 119–226)

## 2018-11-21 LAB — PHOSPHORUS: Phosphorus: 3.7 mg/dL (ref 3.0–4.3)

## 2018-11-21 LAB — VITAMIN D 25 HYDROXY (VIT D DEFICIENCY, FRACTURES): Vit D, 25-Hydroxy: 21.4 ng/mL — ABNORMAL LOW (ref 30.0–100.0)

## 2018-11-21 LAB — TSH: TSH: 0.846 u[IU]/mL (ref 0.450–4.500)

## 2018-11-21 LAB — MAGNESIUM: MAGNESIUM: 2.2 mg/dL (ref 1.6–2.3)

## 2018-11-21 LAB — URIC ACID: Uric Acid: 4.7 mg/dL (ref 2.5–7.1)

## 2018-11-27 ENCOUNTER — Ambulatory Visit (INDEPENDENT_AMBULATORY_CARE_PROVIDER_SITE_OTHER): Payer: Medicare Other

## 2018-11-27 VITALS — BP 118/78 | HR 67 | Temp 97.4°F | Resp 16 | Ht 68.0 in | Wt 140.0 lb

## 2018-11-27 DIAGNOSIS — Z0000 Encounter for general adult medical examination without abnormal findings: Secondary | ICD-10-CM | POA: Diagnosis not present

## 2018-11-27 NOTE — Patient Instructions (Signed)
Judy Sampson , Thank you for taking time to come for your Medicare Wellness Visit. I appreciate your ongoing commitment to your health goals. Please review the following plan we discussed and let me know if I can assist you in the future.   Screening recommendations/referrals: Colonoscopy: Completed 12/24/17 repeat in 2024 Mammogram: Completed 11/22/18 Bone Density: Completed 2015 managed by oncology Recommended yearly ophthalmology/optometry visit for glaucoma screening and checkup Recommended yearly dental visit for hygiene and checkup  Vaccinations: Influenza vaccine: done 08/21/18 Pneumococcal vaccine: done 11/16/15 Tdap vaccine: done 10/29/18 Shingles vaccine: done 06/06/18    Conditions/risks identified: Keep up the great work!  Next appointment: Please follow up in one year for your Medicare Annual Wellness visit.     Preventive Care 16 Years and Older, Female Preventive care refers to lifestyle choices and visits with your health care provider that can promote health and wellness. What does preventive care include?  A yearly physical exam. This is also called an annual well check.  Dental exams once or twice a year.  Routine eye exams. Ask your health care provider how often you should have your eyes checked.  Personal lifestyle choices, including:  Daily care of your teeth and gums.  Regular physical activity.  Eating a healthy diet.  Avoiding tobacco and drug use.  Limiting alcohol use.  Practicing safe sex.  Taking low-dose aspirin every day.  Taking vitamin and mineral supplements as recommended by your health care provider. What happens during an annual well check? The services and screenings done by your health care provider during your annual well check will depend on your age, overall health, lifestyle risk factors, and family history of disease. Counseling  Your health care provider may ask you questions about your:  Alcohol use.  Tobacco  use.  Drug use.  Emotional well-being.  Home and relationship well-being.  Sexual activity.  Eating habits.  History of falls.  Memory and ability to understand (cognition).  Work and work Statistician.  Reproductive health. Screening  You may have the following tests or measurements:  Height, weight, and BMI.  Blood pressure.  Lipid and cholesterol levels. These may be checked every 5 years, or more frequently if you are over 51 years old.  Skin check.  Lung cancer screening. You may have this screening every year starting at age 12 if you have a 30-pack-year history of smoking and currently smoke or have quit within the past 15 years.  Fecal occult blood test (FOBT) of the stool. You may have this test every year starting at age 70.  Flexible sigmoidoscopy or colonoscopy. You may have a sigmoidoscopy every 5 years or a colonoscopy every 10 years starting at age 62.  Hepatitis C blood test.  Hepatitis B blood test.  Sexually transmitted disease (STD) testing.  Diabetes screening. This is done by checking your blood sugar (glucose) after you have not eaten for a while (fasting). You may have this done every 1-3 years.  Bone density scan. This is done to screen for osteoporosis. You may have this done starting at age 31.  Mammogram. This may be done every 1-2 years. Talk to your health care provider about how often you should have regular mammograms. Talk with your health care provider about your test results, treatment options, and if necessary, the need for more tests. Vaccines  Your health care provider may recommend certain vaccines, such as:  Influenza vaccine. This is recommended every year.  Tetanus, diphtheria, and acellular pertussis (Tdap, Td) vaccine.  You may need a Td booster every 10 years.  Zoster vaccine. You may need this after age 64.  Pneumococcal 13-valent conjugate (PCV13) vaccine. One dose is recommended after age 60.  Pneumococcal  polysaccharide (PPSV23) vaccine. One dose is recommended after age 23. Talk to your health care provider about which screenings and vaccines you need and how often you need them. This information is not intended to replace advice given to you by your health care provider. Make sure you discuss any questions you have with your health care provider. Document Released: 11/12/2015 Document Revised: 07/05/2016 Document Reviewed: 08/17/2015 Elsevier Interactive Patient Education  2017 [employer] Prevention in the Home Falls can cause injuries. They can happen to people of all ages. There are many things you can do to make your home safe and to help prevent falls. What can I do on the outside of my home?  Regularly fix the edges of walkways and driveways and fix any cracks.  Remove anything that might make you trip as you walk through a door, such as a raised step or threshold.  Trim any bushes or trees on the path to your home.  Use bright outdoor lighting.  Clear any walking paths of anything that might make someone trip, such as rocks or tools.  Regularly check to see if handrails are loose or broken. Make sure that both sides of any steps have handrails.  Any raised decks and porches should have guardrails on the edges.  Have any leaves, snow, or ice cleared regularly.  Use sand or salt on walking paths during winter.  Clean up any spills in your garage right away. This includes oil or grease spills. What can I do in the bathroom?  Use night lights.  Install grab bars by the toilet and in the tub and shower. Do not use towel bars as grab bars.  Use non-skid mats or decals in the tub or shower.  If you need to sit down in the shower, use a plastic, non-slip stool.  Keep the floor dry. Clean up any water that spills on the floor as soon as it happens.  Remove soap buildup in the tub or shower regularly.  Attach bath mats securely with double-sided non-slip rug  tape.  Do not have throw rugs and other things on the floor that can make you trip. What can I do in the bedroom?  Use night lights.  Make sure that you have a light by your bed that is easy to reach.  Do not use any sheets or blankets that are too big for your bed. They should not hang down onto the floor.  Have a firm chair that has side arms. You can use this for support while you get dressed.  Do not have throw rugs and other things on the floor that can make you trip. What can I do in the kitchen?  Clean up any spills right away.  Avoid walking on wet floors.  Keep items that you use a lot in easy-to-reach places.  If you need to reach something above you, use a strong step stool that has a grab bar.  Keep electrical cords out of the way.  Do not use floor polish or wax that makes floors slippery. If you must use wax, use non-skid floor wax.  Do not have throw rugs and other things on the floor that can make you trip. What can I do with my stairs?  Do not leave any  items on the stairs.  Make sure that there are handrails on both sides of the stairs and use them. Fix handrails that are broken or loose. Make sure that handrails are as long as the stairways.  Check any carpeting to make sure that it is firmly attached to the stairs. Fix any carpet that is loose or worn.  Avoid having throw rugs at the top or bottom of the stairs. If you do have throw rugs, attach them to the floor with carpet tape.  Make sure that you have a light switch at the top of the stairs and the bottom of the stairs. If you do not have them, ask someone to add them for you. What else can I do to help prevent falls?  Wear shoes that:  Do not have high heels.  Have rubber bottoms.  Are comfortable and fit you well.  Are closed at the toe. Do not wear sandals.  If you use a stepladder:  Make sure that it is fully opened. Do not climb a closed stepladder.  Make sure that both sides of the  stepladder are locked into place.  Ask someone to hold it for you, if possible.  Clearly mark and make sure that you can see:  Any grab bars or handrails.  First and last steps.  Where the edge of each step is.  Use tools that help you move around (mobility aids) if they are needed. These include:  Canes.  Walkers.  Scooters.  Crutches.  Turn on the lights when you go into a dark area. Replace any light bulbs as soon as they burn out.  Set up your furniture so you have a clear path. Avoid moving your furniture around.  If any of your floors are uneven, fix them.  If there are any pets around you, be aware of where they are.  Review your medicines with your doctor. Some medicines can make you feel dizzy. This can increase your chance of falling. Ask your doctor what other things that you can do to help prevent falls. This information is not intended to replace advice given to you by your health care provider. Make sure you discuss any questions you have with your health care provider. Document Released: 08/12/2009 Document Revised: 03/23/2016 Document Reviewed: 11/20/2014 Elsevier Interactive Patient Education  2017 [employer].

## 2018-11-27 NOTE — Progress Notes (Signed)
Subjective:   Judy Sampson is a 73 y.o. female who presents for Medicare Annual (Subsequent) preventive examination.  Review of Systems:   Cardiac Risk Factors include: advanced age (>75men, >43 women);dyslipidemia     Objective:     Vitals: BP 118/78 (BP Location: Left Arm, Patient Position: Sitting, Cuff Size: Normal)   Pulse 67   Temp (!) 97.4 F (36.3 C) (Oral)   Resp 16   Ht 5\' 8"  (1.727 m)   Wt 140 lb (63.5 kg)   SpO2 99%   BMI 21.29 kg/m   Body mass index is 21.29 kg/m.  Advanced Directives 11/27/2018 11/21/2017 10/04/2016 11/16/2015  Does Patient Have a Medical Advance Directive? Yes No No No  Type of Advance Directive Living will;Healthcare Power of Attorney - - -  Copy of [place] in Chart? Yes - validated most recent copy scanned in chart (See row information) - - -  Would patient like information on creating a medical advance directive? - Yes (MAU/Ambulatory/Procedural Areas - Information given) - No - patient declined information    Tobacco Social History   Tobacco Use  Smoking Status Never Smoker  Smokeless Tobacco Never Used  Tobacco Comment   smoking cessation materials not required     Counseling given: Not Answered Comment: smoking cessation materials not required   Clinical Intake:  Pre-visit preparation completed: Yes  Pain : No/denies pain     BMI - recorded: 21.32 Nutritional Status: BMI of 19-24  Normal Nutritional Risks: None Diabetes: No  How often do you need to have someone help you when you read instructions, pamphlets, or other written materials from your doctor or pharmacy?: 1 - Never What is the last grade level you completed in school?: Master's degree  Interpreter Needed?: No  Information entered by :: Clemetine Marker LPN  Past Medical History:  Diagnosis Date  . Anxiety   . Claustrophobia   . Depression   . GERD (gastroesophageal reflux disease)   . H/O cold sores    Past Surgical History:    Procedure Laterality Date  . FOOT SURGERY Bilateral   . MASTECTOMY MODIFIED RADICAL Right 1998   XRT/Chemo + 5 yrs  Aromasin  . OOPHORECTOMY Left    Family History  Problem Relation Age of Onset  . Diabetes Mother   . Parkinson's disease Mother   . Breast cancer Mother        33  . Heart disease Father   . Parkinson's disease Brother    Social History   Socioeconomic History  . Marital status: Divorced    Spouse name: Not on file  . Number of children: 0  . Years of education: Not on file  . Highest education level: Master's degree (e.g., MA, MS, MEng, MEd, MSW, MBA)  Occupational History  . Occupation: Retired  Scientific laboratory technician  . Financial resource strain: Not hard at all  . Food insecurity:    Worry: Never true    Inability: Never true  . Transportation needs:    Medical: No    Non-medical: No  Tobacco Use  . Smoking status: Never Smoker  . Smokeless tobacco: Never Used  . Tobacco comment: smoking cessation materials not required  Substance and Sexual Activity  . Alcohol use: Yes    Alcohol/week: 2.0 standard drinks    Types: 2 Standard drinks or equivalent per week    Comment: occasional  . Drug use: No  . Sexual activity: Not Currently  Lifestyle  . Physical  activity:    Days per week: 7 days    Minutes per session: 40 min  . Stress: Not at all  Relationships  . Social connections:    Talks on phone: More than three times a week    Gets together: Three times a week    Attends religious service: More than 4 times per year    Active member of club or organization: No    Attends meetings of clubs or organizations: Never    Relationship status: Divorced  Other Topics Concern  . Not on file  Social History Narrative  . Not on file    Outpatient Encounter Medications as of 11/27/2018  Medication Sig  . Calcium-Magnesium-Vitamin D (CALCIUM MAGNESIUM PO) Take 1 tablet by mouth daily.  . Flaxseed, Linseed, (FLAX SEED OIL) 1000 MG CAPS Take by mouth.  Marland Kitchen  LORazepam (ATIVAN) 0.5 MG tablet Take 1 tablet (0.5 mg total) by mouth every 8 (eight) hours as needed for anxiety. (Patient taking differently: Take 0.5 mg by mouth every 8 (eight) hours as needed for anxiety. Only used when flying)  . MULTIPLE VITAMIN PO Take by mouth.  . Omega-3 Fatty Acids (FISH OIL BURP-LESS) 1000 MG CAPS Take by mouth.  . Probiotic CAPS Take by mouth.  . sertraline (ZOLOFT) 50 MG tablet TAKE 1 TABLET(50 MG) BY MOUTH DAILY  . TURMERIC PO Take 1 tablet by mouth daily.  . valACYclovir (VALTREX) 1000 MG tablet Take 2 tablets (2,000 mg total) by mouth 2 (two) times daily. (Patient taking differently: Take 2,000 mg by mouth 2 (two) times daily. PRN)   No facility-administered encounter medications on file as of 11/27/2018.     Activities of Daily Living In your present state of health, do you have any difficulty performing the following activities: 11/27/2018  Hearing? N  Comment declines hearing aids  Vision? N  Comment wears glasses  Difficulty concentrating or making decisions? N  Walking or climbing stairs? N  Dressing or bathing? N  Doing errands, shopping? N  Preparing Food and eating ? N  Using the Toilet? N  In the past six months, have you accidently leaked urine? Y  Comment wears liner for protection  Do you have problems with loss of bowel control? N  Managing your Medications? N  Managing your Finances? N  Housekeeping or managing your Housekeeping? N  Some recent data might be hidden    Patient Care Team: Glean Hess, MD as PCP - General (Internal Medicine) Dr. Enos Fling (Obstetrics and Gynecology) Laveda Norman, MD as Consulting Physician (Hematology and Oncology) [facility] (Dermatology)    Assessment:   This is a routine wellness examination for Judy Sampson.  Exercise Activities and Dietary recommendations Current Exercise Habits: Structured exercise class, Type of exercise: yoga;walking;stretching;Other - see comments(aerobics  class weekly), Time (Minutes): 45, Frequency (Times/Week): 7, Weekly Exercise (Minutes/Week): 315, Intensity: Moderate, Exercise limited by: None identified  Goals    . DIET - INCREASE WATER INTAKE     Recommend to drink at least 6-8 8oz glasses of water per day.     . Patient Stated     Continue healthy diet and exercise for cholesterol and weight management.        Fall Risk Fall Risk  11/27/2018 11/14/2018 11/21/2017 10/04/2016 05/15/2016  Falls in the past year? 0 0 No No No  Number falls in past yr: 0 0 - - -  Injury with Fall? - 0 - - -  Follow up Falls prevention  discussed Falls evaluation completed - - -   FALL RISK PREVENTION PERTAINING TO THE HOME:  Any stairs in or around the home WITH handrails? Yes  Home free of loose throw rugs in walkways, pet beds, electrical cords, etc? Yes  Adequate lighting in your home to reduce risk of falls? Yes   ASSISTIVE DEVICES UTILIZED TO PREVENT FALLS:  Life alert? No  Use of a cane, walker or w/c? No  Grab bars in the bathroom? No  Shower chair or bench in shower? No  Elevated toilet seat or a handicapped toilet? No   DME ORDERS:  DME order needed?  No   TIMED UP AND GO:  Was the test performed? Yes .  Length of time to ambulate 10 feet: 5 sec.   GAIT:  Appearance of gait: Gait stead-fast and without the use of an assistive device.  Education: Fall risk prevention has been discussed.  Intervention(s) required? No   Depression Screen PHQ 2/9 Scores 11/27/2018 11/14/2018 11/21/2017 10/04/2016  PHQ - 2 Score 0 0 0 0     Cognitive Function     6CIT Screen 11/27/2018 11/21/2017 10/04/2016  What Year? 0 points 0 points 0 points  What month? 0 points 0 points 0 points  What time? 0 points 0 points 0 points  Count back from 20 0 points 0 points 0 points  Months in reverse 0 points 0 points 0 points  Repeat phrase 0 points 0 points 0 points  Total Score 0 0 0    Immunization History  Administered Date(s) Administered  .  Influenza-Unspecified 07/30/2014, 07/01/2015, 08/21/2018  . Pneumococcal Conjugate-13 11/19/2014  . Pneumococcal Polysaccharide-23 11/16/2015  . Tdap 08/31/2015, 10/29/2018  . Zoster 11/01/2007  . Zoster Recombinat (Shingrix) 06/06/2018, 08/13/2018    Qualifies for Shingles Vaccine? Yes  Zostavax completed 2013.Shingrix completed 2019  Tdap: Up to date  Flu Vaccine: Up to date  Pneumococcal Vaccine: Up to date   Screening Tests Health Maintenance  Topic Date Due  . MAMMOGRAM  11/23/2019  . COLONOSCOPY  12/24/2022  . TETANUS/TDAP  10/29/2028  . INFLUENZA VACCINE  Completed  . Hepatitis C Screening  Completed  . PNA vac Low Risk Adult  Completed  . DEXA SCAN  Addressed    Cancer Screenings:  Colorectal Screening: Completed 12/24/17. Repeat every 5 years;   Mammogram: Completed 11/22/18 [facility]. Repeat every year;  Bone Density: Completed 2015, managed by oncology.  Lung Cancer Screening: (Low Dose CT Chest recommended if Age 63-80 years, 30 pack-year currently smoking OR have quit w/in 15years.) does not qualify.   Additional Screening:  Hepatitis C Screening: does qualify; Completed 10/04/16  Vision Screening: Recommended annual ophthalmology exams for early detection of glaucoma and other disorders of the eye. Is the patient up to date with their annual eye exam?  Yes  Who is the provider or what is the name of the office in which the pt attends annual eye exams? Dr. Clydene Laming   Dental Screening: Recommended annual dental exams for proper oral hygiene  Community Resource Referral:  CRR required this visit?  No      Plan:     I have personally reviewed and addressed the Medicare Annual Wellness questionnaire and have noted the following in the patient's chart:  A. Medical and social history B. Use of alcohol, tobacco or illicit drugs  C. Current medications and supplements D. Functional ability and status E.  Nutritional status F.  Physical  activity G. Advance directives H. List  of other physicians I.  Hospitalizations, surgeries, and ER visits in previous 12 months J.  [place] such as hearing and vision if needed, cognitive and depression L. Referrals and appointments   In addition, I have reviewed and discussed with patient certain preventive protocols, quality metrics, and best practice recommendations. A written personalized care plan for preventive services as well as general preventive health recommendations were provided to patient.   Signed,  Clemetine Marker, LPN Nurse Health Advisor   Nurse Notes: pt doing well overall and appreciative of visit today. She has mammogram result and plans to send in MyChart for [place] to have a copy on file as well.

## 2019-03-28 ENCOUNTER — Telehealth: Payer: Self-pay

## 2019-03-28 NOTE — Telephone Encounter (Signed)
Patient called saying she had a dizziness and nausea episode last night. Michela Pitcher it was much worse when she tried laying down.   Called EMS and they did let her know that all her symptoms sounded like vertigo.   Told patient there are two medication she can try over the counter to help. Dramamine, and/or Antivert. Told her either will do. Told pt episodes of vertigo may last up to 10 days and may cause worsening nausea.  She verbalized understanding and said she will get the medication just in case of another episode even tho she is currently feeling fine.

## 2019-04-07 ENCOUNTER — Other Ambulatory Visit: Payer: Self-pay

## 2019-04-07 DIAGNOSIS — B009 Herpesviral infection, unspecified: Secondary | ICD-10-CM

## 2019-04-07 MED ORDER — VALACYCLOVIR HCL 1 G PO TABS: 1000.0000 mg | ORAL_TABLET | Freq: Two times a day (BID) | ORAL | 0 refills | Status: DC | PRN

## 2019-07-20 IMAGING — CR DG FOOT COMPLETE 3+V*L*
3 series · 3 of 3 positions shown · non-contrast
Comparison: None.

CLINICAL DATA: Worsening left little toe pain at the MTP joint for
the past several weeks with associated swelling. No known injury.

EXAM:
LEFT FOOT - COMPLETE 3+ VIEW

[foot ap]
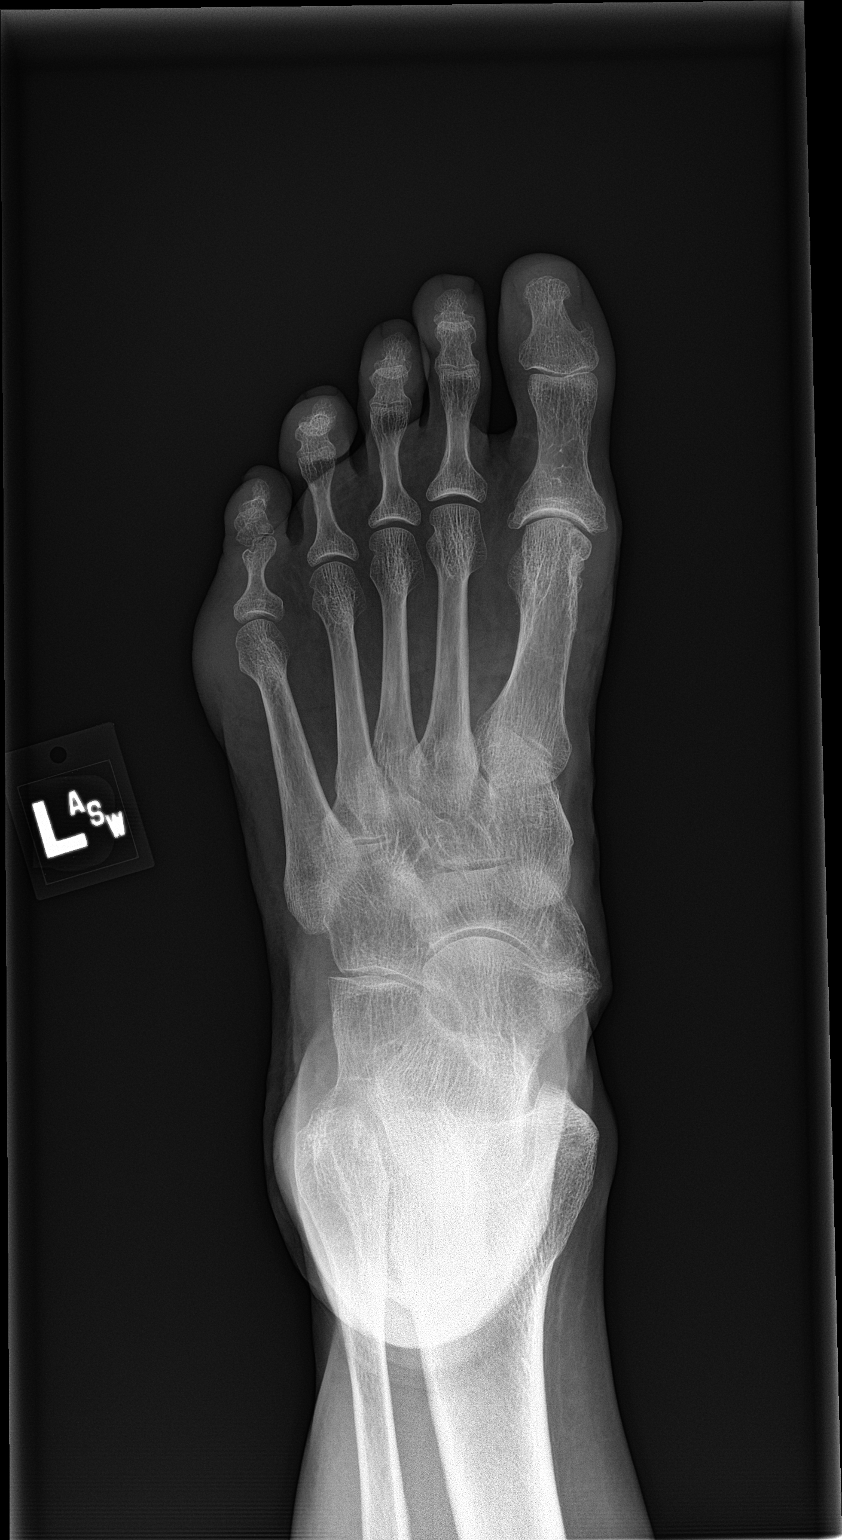

[foot obl]
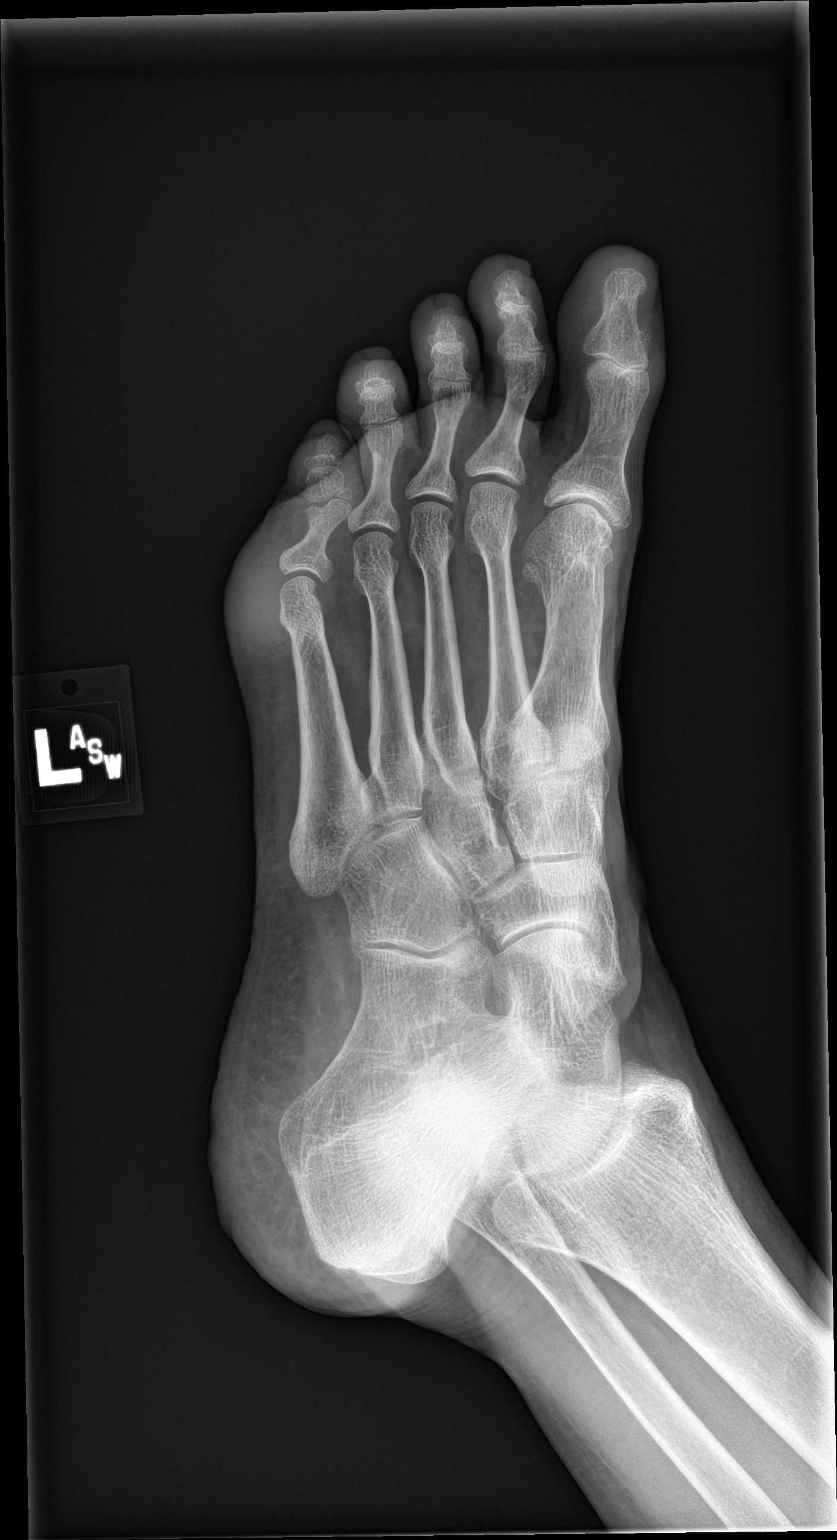

[foot lat]
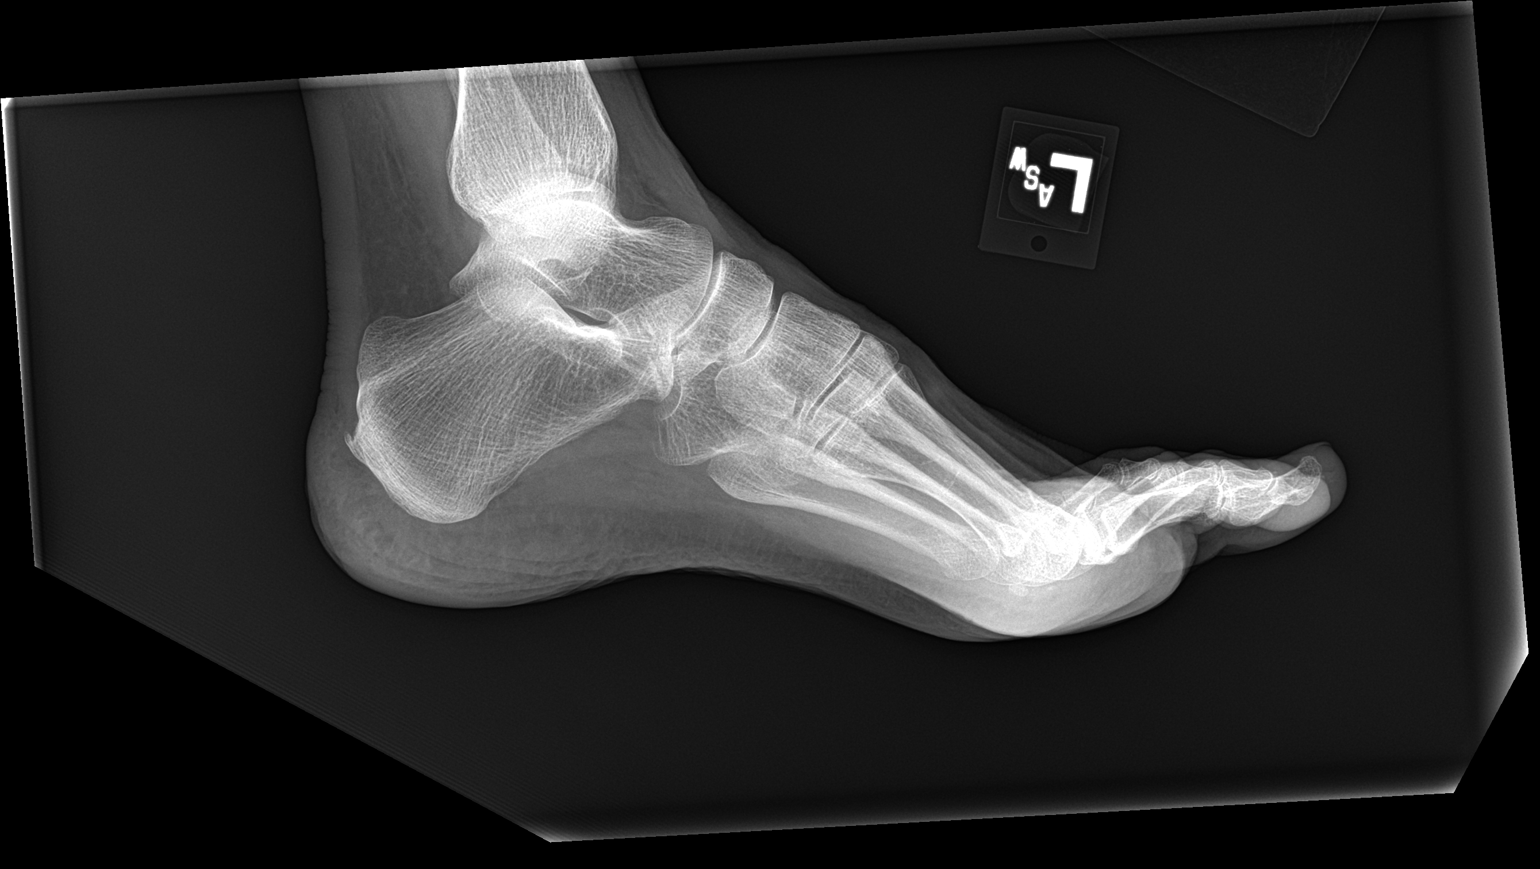

[3 of 3 positions shown; findings below may reference images not displayed]

FINDINGS: Soft tissue swelling lateral to the fifth MTP joint. The underlying
bones have normal appearances. There is an accessory ossification
center between the medial aspects of the fifth proximal and middle
phalanges. No soft tissue gas, bone destruction or periosteal
reaction. Mild to moderate first MTP joint degenerative changes are
noted. Mild posterior calcaneal spur formation.
IMPRESSION: 1. Soft tissue swelling lateral to the fifth MTP joint without the
underlying bony abnormality.
2. Mild-to-moderate first MTP joint degenerative changes.

## 2019-11-14 DIAGNOSIS — D485 Neoplasm of uncertain behavior of skin: Secondary | ICD-10-CM | POA: Diagnosis not present

## 2019-11-20 ENCOUNTER — Ambulatory Visit (INDEPENDENT_AMBULATORY_CARE_PROVIDER_SITE_OTHER): Payer: Medicare PPO | Admitting: Internal Medicine

## 2019-11-20 ENCOUNTER — Other Ambulatory Visit: Payer: Self-pay

## 2019-11-20 ENCOUNTER — Encounter: Payer: Self-pay | Admitting: Internal Medicine

## 2019-11-20 VITALS — BP 112/70 | HR 76 | Temp 97.8°F | Ht 68.0 in | Wt 136.0 lb

## 2019-11-20 DIAGNOSIS — M8580 Other specified disorders of bone density and structure, unspecified site: Secondary | ICD-10-CM | POA: Diagnosis not present

## 2019-11-20 DIAGNOSIS — Z0000 Encounter for general adult medical examination without abnormal findings: Secondary | ICD-10-CM | POA: Diagnosis not present

## 2019-11-20 DIAGNOSIS — F064 Anxiety disorder due to known physiological condition: Secondary | ICD-10-CM

## 2019-11-20 DIAGNOSIS — F411 Generalized anxiety disorder: Secondary | ICD-10-CM | POA: Insufficient documentation

## 2019-11-20 DIAGNOSIS — Z1231 Encounter for screening mammogram for malignant neoplasm of breast: Secondary | ICD-10-CM

## 2019-11-20 DIAGNOSIS — E559 Vitamin D deficiency, unspecified: Secondary | ICD-10-CM

## 2019-11-20 DIAGNOSIS — C50912 Malignant neoplasm of unspecified site of left female breast: Secondary | ICD-10-CM

## 2019-11-20 LAB — POCT URINALYSIS DIPSTICK
Bilirubin, UA: NEGATIVE
Glucose, UA: NEGATIVE
Ketones, UA: NEGATIVE
Leukocytes, UA: NEGATIVE
Nitrite, UA: NEGATIVE
Protein, UA: NEGATIVE
Spec Grav, UA: 1.02 (ref 1.010–1.025)
Urobilinogen, UA: 0.2 E.U./dL
pH, UA: 5 (ref 5.0–8.0)

## 2019-11-20 MED ORDER — SERTRALINE HCL 50 MG PO TABS: ORAL_TABLET | ORAL | 3 refills | Status: DC

## 2019-11-20 NOTE — Patient Instructions (Signed)
Take 2 Calcium/Mg supplements daily to aid with muscle cramps and trembling sensations.

## 2019-11-20 NOTE — Progress Notes (Signed)
Date:  11/20/2019   Name:  Judy Sampson   DOB:  28-Dec-1945   MRN:  [id number]   Chief Complaint: Annual Exam (Oncology will do breast exam.) Judy Sampson is a 74 y.o. female who presents today for her Complete Annual Exam. She feels well. She reports exercising walking and yoga. She reports she is sleeping well. She continues to see Oncology for breast cancer follow up.  She has mammogram and DEXA tomorrow.  She would like her labs sent to her Oncologist.  Mammogram 10/2018 - scheduled tomorrow DEXA  10/2013 - scheduled tomorrow Colonoscopy  11/2017 Immunization History  Administered Date(s) Administered  . Influenza-Unspecified 07/30/2014, 07/01/2015, 08/21/2018, 07/20/2019  . PFIZER SARS-COV-2 Vaccination 10/15/2019, 11/05/2019  . Pneumococcal Conjugate-13 11/19/2014  . Pneumococcal Polysaccharide-23 11/16/2015  . Pneumococcal-Unspecified 07/30/2017  . Tdap 08/31/2015, 10/29/2018  . Zoster 11/01/2007, 08/21/2017, 01/10/2019  . Zoster Recombinat (Shingrix) 06/06/2018, 08/13/2018    HPI  Lab Results  Component Value Date   CREATININE 0.90 11/14/2018   BUN 18 11/14/2018   NA 138 11/14/2018   K 4.5 11/14/2018   CL 101 11/14/2018   CO2 25 11/14/2018   Lab Results  Component Value Date   CHOL 228 (H) 11/14/2018   HDL 76 11/14/2018   LDLCALC 144 (H) 11/14/2018   TRIG 42 11/14/2018   CHOLHDL 3.0 11/14/2018   Lab Results  Component Value Date   TSH 0.846 11/14/2018   No results found for: HGBA1C   Review of Systems  Constitutional: Negative for chills, fatigue and fever.  HENT: Negative for congestion, hearing loss, tinnitus, trouble swallowing and voice change.   Eyes: Negative for visual disturbance.  Respiratory: Negative for cough, chest tightness, shortness of breath and wheezing.   Cardiovascular: Negative for chest pain, palpitations and leg swelling.  Gastrointestinal: Negative for abdominal pain, constipation, diarrhea and vomiting.  Endocrine: Negative  for polydipsia and polyuria.  Genitourinary: Negative for dysuria, frequency, genital sores, vaginal bleeding and vaginal discharge.  Musculoskeletal: Positive for myalgias (frequent muscle cramps in feet - esp when practicing yoga). Negative for arthralgias, gait problem and joint swelling.  Skin: Negative for color change and rash.  Neurological: Positive for tremors (feels her body vibrating in the am at times) and light-headedness (intermittent sx). Negative for dizziness and headaches.  Hematological: Negative for adenopathy. Does not bruise/bleed easily.  Psychiatric/Behavioral: Negative for dysphoric mood and sleep disturbance. The patient is not nervous/anxious.     Patient Active Problem List   Diagnosis Date Noted  . Generalized anxiety disorder 11/20/2019  . Vitamin D deficiency 11/20/2019  . Adenomatous polyp of colon 07/03/2017  . Osteopenia determined by x-ray 10/04/2016  . Muscle spasms of neck 10/04/2016  . Cardiac arrhythmia 10/04/2016  . Hx of herpes genitalis 05/26/2015  . Claustrophobia 05/26/2015  . Acid reflux 05/26/2015  . Personal history of malignant neoplasm of breast 10/30/1996    Allergies  Allergen Reactions  . Bisphosphonates     Past Surgical History:  Procedure Laterality Date  . FOOT SURGERY Bilateral   . MASTECTOMY MODIFIED RADICAL Right 1998   XRT/Chemo + 5 yrs  Aromasin  . OOPHORECTOMY Left     Social History   Tobacco Use  . Smoking status: Never Smoker  . Smokeless tobacco: Never Used  . Tobacco comment: smoking cessation materials not required  Substance Use Topics  . Alcohol use: Yes    Alcohol/week: 2.0 standard drinks    Types: 2 Standard drinks or equivalent per week  Comment: occasional  . Drug use: No     Medication list has been reviewed and updated.  Current Meds  Medication Sig  . Calcium-Magnesium-Vitamin D (CALCIUM MAGNESIUM PO) Take 1 tablet by mouth daily.  . Flaxseed, Linseed, (FLAX SEED OIL) 1000 MG CAPS  Take by mouth.  Marland Kitchen LORazepam (ATIVAN) 0.5 MG tablet Take 1 tablet (0.5 mg total) by mouth every 8 (eight) hours as needed for anxiety. (Patient taking differently: Take 0.5 mg by mouth every 8 (eight) hours as needed for anxiety. Only used when flying)  . MULTIPLE VITAMIN PO Take by mouth.  . Omega-3 Fatty Acids (FISH OIL BURP-LESS) 1000 MG CAPS Take by mouth.  . Probiotic CAPS Take by mouth.  . sertraline (ZOLOFT) 50 MG tablet TAKE 1 TABLET(50 MG) BY MOUTH DAILY  . TURMERIC PO Take 1 tablet by mouth daily.  . valACYclovir (VALTREX) 1000 MG tablet Take 1 tablet (1,000 mg total) by mouth 2 (two) times daily as needed.    PHQ 2/9 Scores 11/20/2019 11/27/2018 11/14/2018 11/21/2017  PHQ - 2 Score 0 0 0 0  PHQ- 9 Score 0 - - -    BP Readings from Last 3 Encounters:  11/20/19 112/70  11/27/18 118/78  11/14/18 112/68    Physical Exam Vitals and nursing note reviewed.  Constitutional:      General: She is not in acute distress.    Appearance: She is well-developed.  HENT:     Head: Normocephalic and atraumatic.     Right Ear: Tympanic membrane and ear canal normal.     Left Ear: Tympanic membrane and ear canal normal.     Nose:     Right Sinus: No maxillary sinus tenderness.     Left Sinus: No maxillary sinus tenderness.  Eyes:     General: No scleral icterus.       Right eye: No discharge.        Left eye: No discharge.     Conjunctiva/sclera: Conjunctivae normal.  Neck:     Thyroid: No thyromegaly.     Vascular: No carotid bruit.  Cardiovascular:     Rate and Rhythm: Normal rate and regular rhythm.     Pulses: Normal pulses.     Heart sounds: Normal heart sounds.  Pulmonary:     Effort: Pulmonary effort is normal. No respiratory distress.     Breath sounds: No wheezing.  Abdominal:     General: Bowel sounds are normal.     Palpations: Abdomen is soft.     Tenderness: There is no abdominal tenderness.  Musculoskeletal:        General: Normal range of motion.     Cervical  back: Normal range of motion. No erythema.  Lymphadenopathy:     Cervical: No cervical adenopathy.  Skin:    General: Skin is warm and dry.     Findings: No rash.  Neurological:     Mental Status: She is alert and oriented to person, place, and time.     Cranial Nerves: No cranial nerve deficit.     Sensory: No sensory deficit.     Deep Tendon Reflexes: Reflexes are normal and symmetric.  Psychiatric:        Speech: Speech normal.        Behavior: Behavior normal.        Thought Content: Thought content normal.     Wt Readings from Last 3 Encounters:  11/20/19 136 lb (61.7 kg)  11/27/18 140 lb (63.5 kg)  11/14/18  140 lb 3.2 oz (63.6 kg)    BP 112/70   Pulse 76   Temp 97.8 F (36.6 C) (Oral)   Ht 5\' 8"  (1.727 m)   Wt 136 lb (61.7 kg)   SpO2 99%   BMI 20.68 kg/m   Assessment and Plan: 1. Annual physical exam Normal exam Continue healthy diet and exercise Increase calcium/mg supplement to 2 per day - CBC with Differential/Platelet - Lipid panel - POCT urinalysis dipstick  2. Encounter for screening mammogram for breast cancer Scheduled by Oncology  3. Osteopenia determined by x-ray DEXA scheduled tomorrow Ask Oncology to send notes and results  4. Anxiety disorder due to known physiological condition Clinically stable and doing well on zoloft Can continue current dose or cut back to 25 mg - TSH - sertraline (ZOLOFT) 50 MG tablet; Take 1 tablet by mouth daily.  Dispense: 90 tablet; Refill: 3  5. Malignant neoplasm of left female breast, unspecified estrogen receptor status, unspecified site of breast ([place]) Followed by Oncology - Comprehensive metabolic panel - Uric acid - Magnesium - Lactate Dehydrogenase (LDH) - Phosphorus  6. Vitamin D deficiency supplemented - VITAMIN D 25 Hydroxy (Vit-D Deficiency, Fractures)   Partially dictated using Editor, commissioning. Any errors are unintentional.  Halina Maidens, MD [facility]  11/20/2019

## 2019-11-21 DIAGNOSIS — R922 Inconclusive mammogram: Secondary | ICD-10-CM | POA: Diagnosis not present

## 2019-11-21 DIAGNOSIS — M810 Age-related osteoporosis without current pathological fracture: Secondary | ICD-10-CM | POA: Diagnosis not present

## 2019-11-21 DIAGNOSIS — Z13820 Encounter for screening for osteoporosis: Secondary | ICD-10-CM | POA: Diagnosis not present

## 2019-11-21 DIAGNOSIS — Z853 Personal history of malignant neoplasm of breast: Secondary | ICD-10-CM | POA: Diagnosis not present

## 2019-11-21 DIAGNOSIS — Z780 Asymptomatic menopausal state: Secondary | ICD-10-CM | POA: Diagnosis not present

## 2019-11-21 LAB — CBC WITH DIFFERENTIAL/PLATELET
Basophils Absolute: 0 10*3/uL (ref 0.0–0.2)
Basos: 1 %
EOS (ABSOLUTE): 0.1 10*3/uL (ref 0.0–0.4)
Eos: 2 %
Hematocrit: 41.5 % (ref 34.0–46.6)
Hemoglobin: 14 g/dL (ref 11.1–15.9)
Immature Grans (Abs): 0 10*3/uL (ref 0.0–0.1)
Immature Granulocytes: 0 %
Lymphocytes Absolute: 1.8 10*3/uL (ref 0.7–3.1)
Lymphs: 36 %
MCH: 30.1 pg (ref 26.6–33.0)
MCHC: 33.7 g/dL (ref 31.5–35.7)
MCV: 89 fL (ref 79–97)
Monocytes Absolute: 0.4 10*3/uL (ref 0.1–0.9)
Monocytes: 9 %
Neutrophils Absolute: 2.6 10*3/uL (ref 1.4–7.0)
Neutrophils: 52 %
Platelets: 180 10*3/uL (ref 150–450)
RBC: 4.65 x10E6/uL (ref 3.77–5.28)
RDW: 12.5 % (ref 11.7–15.4)
WBC: 5 10*3/uL (ref 3.4–10.8)

## 2019-11-21 LAB — COMPREHENSIVE METABOLIC PANEL
ALT: 14 IU/L (ref 0–32)
AST: 20 IU/L (ref 0–40)
Albumin/Globulin Ratio: 1.9 (ref 1.2–2.2)
Albumin: 4.3 g/dL (ref 3.7–4.7)
Alkaline Phosphatase: 76 IU/L (ref 39–117)
BUN/Creatinine Ratio: 21 (ref 12–28)
BUN: 18 mg/dL (ref 8–27)
Bilirubin Total: 0.3 mg/dL (ref 0.0–1.2)
CO2: 24 mmol/L (ref 20–29)
Calcium: 9.3 mg/dL (ref 8.7–10.3)
Chloride: 99 mmol/L (ref 96–106)
Creatinine, Ser: 0.87 mg/dL (ref 0.57–1.00)
GFR calc Af Amer: 78 mL/min/{1.73_m2} (ref >59)
GFR calc non Af Amer: 67 mL/min/{1.73_m2} (ref >59)
Globulin, Total: 2.3 g/dL (ref 1.5–4.5)
Glucose: 91 mg/dL (ref 65–99)
Potassium: 4.3 mmol/L (ref 3.5–5.2)
Sodium: 136 mmol/L (ref 134–144)
Total Protein: 6.6 g/dL (ref 6.0–8.5)

## 2019-11-21 LAB — LIPID PANEL
Chol/HDL Ratio: 2.9 ratio (ref 0.0–4.4)
Cholesterol, Total: 235 mg/dL — ABNORMAL HIGH (ref 100–199)
HDL: 80 mg/dL (ref >39)
LDL Chol Calc (NIH): 145 mg/dL — ABNORMAL HIGH (ref 0–99)
Triglycerides: 59 mg/dL (ref 0–149)
VLDL Cholesterol Cal: 10 mg/dL (ref 5–40)

## 2019-11-21 LAB — TSH: TSH: 1.58 u[IU]/mL (ref 0.450–4.500)

## 2019-11-21 LAB — URIC ACID: Uric Acid: 4.6 mg/dL (ref 3.1–7.9)

## 2019-11-21 LAB — VITAMIN D 25 HYDROXY (VIT D DEFICIENCY, FRACTURES): Vit D, 25-Hydroxy: 23 ng/mL — ABNORMAL LOW (ref 30.0–100.0)

## 2019-11-21 LAB — PHOSPHORUS: Phosphorus: 3.9 mg/dL (ref 3.0–4.3)

## 2019-11-21 LAB — LACTATE DEHYDROGENASE: LDH: 168 IU/L (ref 119–226)

## 2019-11-21 LAB — MAGNESIUM: Magnesium: 2 mg/dL (ref 1.6–2.3)

## 2019-11-25 DIAGNOSIS — Z170 Estrogen receptor positive status [ER+]: Secondary | ICD-10-CM | POA: Diagnosis not present

## 2019-11-25 DIAGNOSIS — C50912 Malignant neoplasm of unspecified site of left female breast: Secondary | ICD-10-CM | POA: Diagnosis not present

## 2019-12-03 ENCOUNTER — Ambulatory Visit (INDEPENDENT_AMBULATORY_CARE_PROVIDER_SITE_OTHER): Payer: Medicare PPO

## 2019-12-03 VITALS — Ht 68.0 in | Wt 136.0 lb

## 2019-12-03 DIAGNOSIS — Z0000 Encounter for general adult medical examination without abnormal findings: Secondary | ICD-10-CM | POA: Diagnosis not present

## 2019-12-03 NOTE — Patient Instructions (Signed)
Judy Sampson , Thank you for taking time to come for your Medicare Wellness Visit. I appreciate your ongoing commitment to your health goals. Please review the following plan we discussed and let me know if I can assist you in the future.   Screening recommendations/referrals: Colonoscopy: done 12/24/17. Repeat in 2024. Mammogram: done 11/21/19 Bone Density: done 11/21/19 Recommended yearly ophthalmology/optometry visit for glaucoma screening and checkup Recommended yearly dental visit for hygiene and checkup  Vaccinations: Influenza vaccine: done 07/20/19 Pneumococcal vaccine: done 07/30/17 Tdap vaccine: done 10/29/18 Shingles vaccine: Shingrix completed 08/13/18   Conditions/risks identified: Keep up the great work!  Next appointment: Please follow up in one year for your Medicare Annual Wellness visit.     Preventive Care 33 Years and Older, Female Preventive care refers to lifestyle choices and visits with your health care provider that can promote health and wellness. What does preventive care include?  A yearly physical exam. This is also called an annual well check.  Dental exams once or twice a year.  Routine eye exams. Ask your health care provider how often you should have your eyes checked.  Personal lifestyle choices, including:  Daily care of your teeth and gums.  Regular physical activity.  Eating a healthy diet.  Avoiding tobacco and drug use.  Limiting alcohol use.  Practicing safe sex.  Taking low-dose aspirin every day.  Taking vitamin and mineral supplements as recommended by your health care provider. What happens during an annual well check? The services and screenings done by your health care provider during your annual well check will depend on your age, overall health, lifestyle risk factors, and family history of disease. Counseling  Your health care provider may ask you questions about your:  Alcohol use.  Tobacco use.  Drug  use.  Emotional well-being.  Home and relationship well-being.  Sexual activity.  Eating habits.  History of falls.  Memory and ability to understand (cognition).  Work and work Statistician.  Reproductive health. Screening  You may have the following tests or measurements:  Height, weight, and BMI.  Blood pressure.  Lipid and cholesterol levels. These may be checked every 5 years, or more frequently if you are over 81 years old.  Skin check.  Lung cancer screening. You may have this screening every year starting at age 41 if you have a 30-pack-year history of smoking and currently smoke or have quit within the past 15 years.  Fecal occult blood test (FOBT) of the stool. You may have this test every year starting at age 54.  Flexible sigmoidoscopy or colonoscopy. You may have a sigmoidoscopy every 5 years or a colonoscopy every 10 years starting at age 38.  Hepatitis C blood test.  Hepatitis B blood test.  Sexually transmitted disease (STD) testing.  Diabetes screening. This is done by checking your blood sugar (glucose) after you have not eaten for a while (fasting). You may have this done every 1-3 years.  Bone density scan. This is done to screen for osteoporosis. You may have this done starting at age 33.  Mammogram. This may be done every 1-2 years. Talk to your health care provider about how often you should have regular mammograms. Talk with your health care provider about your test results, treatment options, and if necessary, the need for more tests. Vaccines  Your health care provider may recommend certain vaccines, such as:  Influenza vaccine. This is recommended every year.  Tetanus, diphtheria, and acellular pertussis (Tdap, Td) vaccine. You may need  a Td booster every 10 years.  Zoster vaccine. You may need this after age 5.  Pneumococcal 13-valent conjugate (PCV13) vaccine. One dose is recommended after age 5.  Pneumococcal polysaccharide  (PPSV23) vaccine. One dose is recommended after age 81. Talk to your health care provider about which screenings and vaccines you need and how often you need them. This information is not intended to replace advice given to you by your health care provider. Make sure you discuss any questions you have with your health care provider. Document Released: 11/12/2015 Document Revised: 07/05/2016 Document Reviewed: 08/17/2015 Elsevier Interactive Patient Education  2017 [employer] Prevention in the Home Falls can cause injuries. They can happen to people of all ages. There are many things you can do to make your home safe and to help prevent falls. What can I do on the outside of my home?  Regularly fix the edges of walkways and driveways and fix any cracks.  Remove anything that might make you trip as you walk through a door, such as a raised step or threshold.  Trim any bushes or trees on the path to your home.  Use bright outdoor lighting.  Clear any walking paths of anything that might make someone trip, such as rocks or tools.  Regularly check to see if handrails are loose or broken. Make sure that both sides of any steps have handrails.  Any raised decks and porches should have guardrails on the edges.  Have any leaves, snow, or ice cleared regularly.  Use sand or salt on walking paths during winter.  Clean up any spills in your garage right away. This includes oil or grease spills. What can I do in the bathroom?  Use night lights.  Install grab bars by the toilet and in the tub and shower. Do not use towel bars as grab bars.  Use non-skid mats or decals in the tub or shower.  If you need to sit down in the shower, use a plastic, non-slip stool.  Keep the floor dry. Clean up any water that spills on the floor as soon as it happens.  Remove soap buildup in the tub or shower regularly.  Attach bath mats securely with double-sided non-slip rug tape.  Do not have  throw rugs and other things on the floor that can make you trip. What can I do in the bedroom?  Use night lights.  Make sure that you have a light by your bed that is easy to reach.  Do not use any sheets or blankets that are too big for your bed. They should not hang down onto the floor.  Have a firm chair that has side arms. You can use this for support while you get dressed.  Do not have throw rugs and other things on the floor that can make you trip. What can I do in the kitchen?  Clean up any spills right away.  Avoid walking on wet floors.  Keep items that you use a lot in easy-to-reach places.  If you need to reach something above you, use a strong step stool that has a grab bar.  Keep electrical cords out of the way.  Do not use floor polish or wax that makes floors slippery. If you must use wax, use non-skid floor wax.  Do not have throw rugs and other things on the floor that can make you trip. What can I do with my stairs?  Do not leave any items on the  stairs.  Make sure that there are handrails on both sides of the stairs and use them. Fix handrails that are broken or loose. Make sure that handrails are as long as the stairways.  Check any carpeting to make sure that it is firmly attached to the stairs. Fix any carpet that is loose or worn.  Avoid having throw rugs at the top or bottom of the stairs. If you do have throw rugs, attach them to the floor with carpet tape.  Make sure that you have a light switch at the top of the stairs and the bottom of the stairs. If you do not have them, ask someone to add them for you. What else can I do to help prevent falls?  Wear shoes that:  Do not have high heels.  Have rubber bottoms.  Are comfortable and fit you well.  Are closed at the toe. Do not wear sandals.  If you use a stepladder:  Make sure that it is fully opened. Do not climb a closed stepladder.  Make sure that both sides of the stepladder are  locked into place.  Ask someone to hold it for you, if possible.  Clearly mark and make sure that you can see:  Any grab bars or handrails.  First and last steps.  Where the edge of each step is.  Use tools that help you move around (mobility aids) if they are needed. These include:  Canes.  Walkers.  Scooters.  Crutches.  Turn on the lights when you go into a dark area. Replace any light bulbs as soon as they burn out.  Set up your furniture so you have a clear path. Avoid moving your furniture around.  If any of your floors are uneven, fix them.  If there are any pets around you, be aware of where they are.  Review your medicines with your doctor. Some medicines can make you feel dizzy. This can increase your chance of falling. Ask your doctor what other things that you can do to help prevent falls. This information is not intended to replace advice given to you by your health care provider. Make sure you discuss any questions you have with your health care provider. Document Released: 08/12/2009 Document Revised: 03/23/2016 Document Reviewed: 11/20/2014 Elsevier Interactive Patient Education  2017 [employer].

## 2019-12-03 NOTE — Progress Notes (Signed)
Subjective:   Judy Sampson is a 74 y.o. female who presents for Medicare Annual (Subsequent) preventive examination.  Virtual Visit via Telephone Note  I connected with Judy Sampson on 12/03/19 at  8:40 AM EST by telephone and verified that I am speaking with the correct person using two identifiers.  Medicare Annual Wellness visit completed telephonically due to Covid-19 pandemic.   Location: Patient: home Provider: office   I discussed the limitations, risks, security and privacy concerns of performing an evaluation and management service by telephone and the availability of in person appointments. The patient expressed understanding and agreed to proceed.  Some vital signs may be absent or patient reported.   Clemetine Marker, LPN     Review of Systems:   Cardiac Risk Factors include: advanced age (>81men, >44 women)     Objective:     Vitals: Ht 5\' 8"  (1.727 m)   Wt 136 lb (61.7 kg)   BMI 20.68 kg/m   Body mass index is 20.68 kg/m.  Advanced Directives 12/03/2019 11/27/2018 11/21/2017 10/04/2016 11/16/2015  Does Patient Have a Medical Advance Directive? Yes Yes No No No  Type of Paramedic of [place];Living will Living will;Healthcare Power of Attorney - - -  Copy of [place] in Chart? Yes - validated most recent copy scanned in chart (See row information) Yes - validated most recent copy scanned in chart (See row information) - - -  Would patient like information on creating a medical advance directive? - - Yes (MAU/Ambulatory/Procedural Areas - Information given) - No - patient declined information    Tobacco Social History   Tobacco Use  Smoking Status Never Smoker  Smokeless Tobacco Never Used  Tobacco Comment   smoking cessation materials not required     Counseling given: Not Answered Comment: smoking cessation materials not required   Clinical Intake:  Pre-visit preparation completed: Yes  Pain : No/denies  pain     BMI - recorded: 20.68 Nutritional Status: BMI of 19-24  Normal Nutritional Risks: None Diabetes: No  How often do you need to have someone help you when you read instructions, pamphlets, or other written materials from your doctor or pharmacy?: 1 - Never  Interpreter Needed?: No  Information entered by :: Clemetine Marker LPN  Past Medical History:  Diagnosis Date  . Anxiety   . Cancer ([place])    breast  . Claustrophobia   . Depression   . GERD (gastroesophageal reflux disease)   . H/O cold sores    Past Surgical History:  Procedure Laterality Date  . FOOT SURGERY Bilateral   . MASTECTOMY MODIFIED RADICAL Right 1998   XRT/Chemo + 5 yrs  Aromasin  . OOPHORECTOMY Left    Family History  Problem Relation Age of Onset  . Diabetes Mother   . Parkinson's disease Mother   . Breast cancer Mother        104  . Heart disease Father   . Parkinson's disease Brother    Social History   Socioeconomic History  . Marital status: Divorced    Spouse name: Not on file  . Number of children: 0  . Years of education: Not on file  . Highest education level: Master's degree (e.g., MA, MS, MEng, MEd, MSW, MBA)  Occupational History  . Occupation: Retired  Tobacco Use  . Smoking status: Never Smoker  . Smokeless tobacco: Never Used  . Tobacco comment: smoking cessation materials not required  Substance and Sexual Activity  .  Alcohol use: Yes    Alcohol/week: 2.0 standard drinks    Types: 2 Standard drinks or equivalent per week    Comment: occasional  . Drug use: No  . Sexual activity: Not Currently  Other Topics Concern  . Not on file  Social History Narrative  . Not on file   Social Determinants of Health   Financial Resource Strain: Low Risk   . Difficulty of Paying Living Expenses: Not hard at all  Food Insecurity: No Food Insecurity  . Worried About Charity fundraiser in the Last Year: Never true  . Ran Out of Food in the Last Year: Never true  Transportation  Needs: No Transportation Needs  . Lack of Transportation (Medical): No  . Lack of Transportation (Non-Medical): No  Physical Activity: Sufficiently Active  . Days of Exercise per Week: 7 days  . Minutes of Exercise per Session: 40 min  Stress: No Stress Concern Present  . Feeling of Stress : Not at all  Social Connections: Somewhat Isolated  . Frequency of Communication with Friends and Family: More than three times a week  . Frequency of Social Gatherings with Friends and Family: Three times a week  . Attends Religious Services: More than 4 times per year  . Active Member of Clubs or Organizations: No  . Attends Archivist Meetings: Never  . Marital Status: Divorced    Outpatient Encounter Medications as of 12/03/2019  Medication Sig  . Calcium-Magnesium-Vitamin D (CALCIUM MAGNESIUM PO) Take 4 tablets by mouth daily.   . Flaxseed, Linseed, (FLAX SEED OIL) 1000 MG CAPS Take by mouth.  Marland Kitchen LORazepam (ATIVAN) 0.5 MG tablet Take 1 tablet (0.5 mg total) by mouth every 8 (eight) hours as needed for anxiety. (Patient taking differently: Take 0.5 mg by mouth every 8 (eight) hours as needed for anxiety. Only used when flying)  . Omega-3 Fatty Acids (FISH OIL BURP-LESS) 1000 MG CAPS Take by mouth.  . Probiotic CAPS Take by mouth.  . sertraline (ZOLOFT) 50 MG tablet Take 1 tablet by mouth daily. (Patient taking differently: Take 1 tablet by mouth daily. Pt taking 25mg  daily.)  . TURMERIC PO Take 1 tablet by mouth daily.  . valACYclovir (VALTREX) 1000 MG tablet Take 1 tablet (1,000 mg total) by mouth 2 (two) times daily as needed.  . [DISCONTINUED] MULTIPLE VITAMIN PO Take by mouth.   No facility-administered encounter medications on file as of 12/03/2019.    Activities of Daily Living In your present state of health, do you have any difficulty performing the following activities: 12/03/2019  Hearing? N  Comment declines hearing aids  Vision? N  Difficulty concentrating or making  decisions? N  Walking or climbing stairs? N  Dressing or bathing? N  Doing errands, shopping? N  Preparing Food and eating ? N  Using the Toilet? N  In the past six months, have you accidently leaked urine? Y  Comment occasional leakage with urge, wears liners for protection  Do you have problems with loss of bowel control? N  Managing your Medications? N  Managing your Finances? N  Housekeeping or managing your Housekeeping? N  Some recent data might be hidden    Patient Care Team: Glean Hess, MD as PCP - General (Internal Medicine) Dr. Enos Fling (Obstetrics and Gynecology) Laveda Norman, MD as Consulting Physician (Hematology and Oncology) [facility] (Dermatology)    Assessment:   This is a routine wellness examination for Neelam.  Exercise Activities and Dietary recommendations  Current Exercise Habits: Home exercise routine, Type of exercise: walking;Other - see comments(yoga), Time (Minutes): 40, Frequency (Times/Week): 7, Weekly Exercise (Minutes/Week): 280, Intensity: Moderate, Exercise limited by: None identified  Goals    . DIET - INCREASE WATER INTAKE     Recommend to drink at least 6-8 8oz glasses of water per day.     . Patient Stated     Continue healthy diet and exercise for cholesterol and weight management.        Fall Risk Fall Risk  12/03/2019 11/20/2019 11/27/2018 11/14/2018 11/21/2017  Falls in the past year? 0 0 0 0 No  Number falls in past yr: 0 0 0 0 -  Injury with Fall? 0 0 - 0 -  Risk for fall due to : No Fall Risks - - - -  Follow up Falls prevention discussed Falls evaluation completed Falls prevention discussed Falls evaluation completed -   FALL RISK PREVENTION PERTAINING TO THE HOME:  Any stairs in or around the home? Yes  If so, do they handrails? Yes   Home free of loose throw rugs in walkways, pet beds, electrical cords, etc? Yes  Adequate lighting in your home to reduce risk of falls? Yes   ASSISTIVE DEVICES UTILIZED  TO PREVENT FALLS:  Life alert? No  Use of a cane, walker or w/c? No  Grab bars in the bathroom? No  Shower chair or bench in shower? No  Elevated toilet seat or a handicapped toilet? No   DME ORDERS:  DME order needed?  No   TIMED UP AND GO:  Was the test performed? No . Telephonic visit.   Education: Fall risk prevention has been discussed.  Intervention(s) required? No    Depression Screen PHQ 2/9 Scores 12/03/2019 11/20/2019 11/27/2018 11/14/2018  PHQ - 2 Score 0 0 0 0  PHQ- 9 Score - 0 - -     Cognitive Function - 6CIT deferred for 2021 AWV, pt has no memory issues.       6CIT Screen 11/27/2018 11/21/2017 10/04/2016  What Year? 0 points 0 points 0 points  What month? 0 points 0 points 0 points  What time? 0 points 0 points 0 points  Count back from 20 0 points 0 points 0 points  Months in reverse 0 points 0 points 0 points  Repeat phrase 0 points 0 points 0 points  Total Score 0 0 0    Immunization History  Administered Date(s) Administered  . Influenza-Unspecified 07/30/2014, 07/01/2015, 08/21/2018, 07/20/2019  . PFIZER SARS-COV-2 Vaccination 10/15/2019, 11/05/2019  . Pneumococcal Conjugate-13 11/19/2014  . Pneumococcal Polysaccharide-23 11/16/2015  . Pneumococcal-Unspecified 07/30/2017  . Tdap 08/31/2015, 10/29/2018  . Zoster 11/01/2007, 08/21/2017, 01/10/2019  . Zoster Recombinat (Shingrix) 06/06/2018, 08/13/2018    Qualifies for Shingles Vaccine? Yes  Shingrix series completed.   Tdap: Up to date  Flu Vaccine: Up to date  Pneumococcal Vaccine: Up to date   Screening Tests Health Maintenance  Topic Date Due  . MAMMOGRAM  11/23/2019  . COLONOSCOPY  12/24/2022  . TETANUS/TDAP  10/29/2028  . INFLUENZA VACCINE  Completed  . DEXA SCAN  Completed  . PNA vac Low Risk Adult  Completed  . Hepatitis C Screening  Addressed    Cancer Screenings:  Colorectal Screening: Completed 12/24/17. Repeat every 5 years;   Mammogram: Completed 11/21/19. Repeat every  year; [facility]  Bone Density: Completed 11/21/19. Results reflect  OSTEOPOROSIS. Repeat every 2 years. [facility].  Lung Cancer Screening: (Low Dose CT Chest recommended  if Age 71-80 years, 72 pack-year currently smoking OR have quit w/in 15years.) does not qualify.   Additional Screening:  Hepatitis C Screening: does qualify; Completed 10/04/16  Vision Screening: Recommended annual ophthalmology exams for early detection of glaucoma and other disorders of the eye. Is the patient up to date with their annual eye exam?  Yes  Who is the provider or what is the name of the office in which the pt attends annual eye exams? Dr. Marina Gravel  Dental Screening: Recommended annual dental exams for proper oral hygiene  Community Resource Referral:  CRR required this visit?  No      Plan:     I have personally reviewed and addressed the Medicare Annual Wellness questionnaire and have noted the following in the patient's chart:  A. Medical and social history B. Use of alcohol, tobacco or illicit drugs  C. Current medications and supplements D. Functional ability and status E.  Nutritional status F.  Physical activity G. Advance directives H. List of other physicians I.  Hospitalizations, surgeries, and ER visits in previous 12 months J.  [place] such as hearing and vision if needed, cognitive and depression L. Referrals and appointments   In addition, I have reviewed and discussed with patient certain preventive protocols, quality metrics, and best practice recommendations. A written personalized care plan for preventive services as well as general preventive health recommendations were provided to patient.   Signed,  Clemetine Marker, LPN Nurse Health Advisor   Nurse Notes: pt states her oncologist advised her to discuss her cholesterol results with PCP. Notified patient current total cholesterol consistent with readings for the past 2 years and overall low risk due to no  other comorbidities. Recommended monitoring saturated fats and physical activity which she is already doing well.

## 2020-03-23 DIAGNOSIS — M62830 Muscle spasm of back: Secondary | ICD-10-CM | POA: Diagnosis not present

## 2020-03-23 DIAGNOSIS — M9904 Segmental and somatic dysfunction of sacral region: Secondary | ICD-10-CM | POA: Diagnosis not present

## 2020-03-23 DIAGNOSIS — M9901 Segmental and somatic dysfunction of cervical region: Secondary | ICD-10-CM | POA: Diagnosis not present

## 2020-03-23 DIAGNOSIS — M9902 Segmental and somatic dysfunction of thoracic region: Secondary | ICD-10-CM | POA: Diagnosis not present

## 2020-03-23 DIAGNOSIS — M9903 Segmental and somatic dysfunction of lumbar region: Secondary | ICD-10-CM | POA: Diagnosis not present

## 2020-04-07 DIAGNOSIS — M60811 Other myositis, right shoulder: Secondary | ICD-10-CM | POA: Diagnosis not present

## 2020-04-07 DIAGNOSIS — M9902 Segmental and somatic dysfunction of thoracic region: Secondary | ICD-10-CM | POA: Diagnosis not present

## 2020-04-07 DIAGNOSIS — M62830 Muscle spasm of back: Secondary | ICD-10-CM | POA: Diagnosis not present

## 2020-04-07 DIAGNOSIS — M9901 Segmental and somatic dysfunction of cervical region: Secondary | ICD-10-CM | POA: Diagnosis not present

## 2020-04-07 DIAGNOSIS — M9904 Segmental and somatic dysfunction of sacral region: Secondary | ICD-10-CM | POA: Diagnosis not present

## 2020-06-03 ENCOUNTER — Other Ambulatory Visit: Payer: Self-pay | Admitting: Internal Medicine

## 2020-06-03 DIAGNOSIS — B009 Herpesviral infection, unspecified: Secondary | ICD-10-CM

## 2020-06-03 MED ORDER — VALACYCLOVIR HCL 1 G PO TABS: 1000.0000 mg | ORAL_TABLET | Freq: Two times a day (BID) | ORAL | 0 refills | Status: DC | PRN

## 2020-06-03 NOTE — Telephone Encounter (Signed)
Medication Refill - Medication: valACYclovir (VALTREX) 1000 MG tablet    Has the patient contacted their pharmacy? Yes.   (Agent: If no, request that the patient contact the pharmacy for the refill.) (Agent: If yes, when and what did the pharmacy advise?)  Preferred Pharmacy (with phone number or street name):  [facility] [place], [place] - [address]  [address] [place] [address]  Phone: [phone] Fax: [phone]     Agent: Please be advised that RX refills may take up to 3 business days. We ask that you follow-up with your pharmacy.

## 2020-06-17 DIAGNOSIS — M9901 Segmental and somatic dysfunction of cervical region: Secondary | ICD-10-CM | POA: Diagnosis not present

## 2020-06-17 DIAGNOSIS — M9903 Segmental and somatic dysfunction of lumbar region: Secondary | ICD-10-CM | POA: Diagnosis not present

## 2020-06-17 DIAGNOSIS — M25552 Pain in left hip: Secondary | ICD-10-CM | POA: Diagnosis not present

## 2020-06-17 DIAGNOSIS — M9904 Segmental and somatic dysfunction of sacral region: Secondary | ICD-10-CM | POA: Diagnosis not present

## 2020-06-17 DIAGNOSIS — M62830 Muscle spasm of back: Secondary | ICD-10-CM | POA: Diagnosis not present

## 2020-06-21 DIAGNOSIS — M9901 Segmental and somatic dysfunction of cervical region: Secondary | ICD-10-CM | POA: Diagnosis not present

## 2020-06-21 DIAGNOSIS — M62830 Muscle spasm of back: Secondary | ICD-10-CM | POA: Diagnosis not present

## 2020-06-21 DIAGNOSIS — M9903 Segmental and somatic dysfunction of lumbar region: Secondary | ICD-10-CM | POA: Diagnosis not present

## 2020-06-21 DIAGNOSIS — M9902 Segmental and somatic dysfunction of thoracic region: Secondary | ICD-10-CM | POA: Diagnosis not present

## 2020-06-21 DIAGNOSIS — M25562 Pain in left knee: Secondary | ICD-10-CM | POA: Diagnosis not present

## 2020-07-08 DIAGNOSIS — M25552 Pain in left hip: Secondary | ICD-10-CM | POA: Diagnosis not present

## 2020-07-08 DIAGNOSIS — M60812 Other myositis, left shoulder: Secondary | ICD-10-CM | POA: Diagnosis not present

## 2020-07-08 DIAGNOSIS — M9903 Segmental and somatic dysfunction of lumbar region: Secondary | ICD-10-CM | POA: Diagnosis not present

## 2020-07-08 DIAGNOSIS — M62830 Muscle spasm of back: Secondary | ICD-10-CM | POA: Diagnosis not present

## 2020-07-08 DIAGNOSIS — M9901 Segmental and somatic dysfunction of cervical region: Secondary | ICD-10-CM | POA: Diagnosis not present

## 2020-07-08 DIAGNOSIS — M9902 Segmental and somatic dysfunction of thoracic region: Secondary | ICD-10-CM | POA: Diagnosis not present

## 2020-07-08 DIAGNOSIS — M25511 Pain in right shoulder: Secondary | ICD-10-CM | POA: Diagnosis not present

## 2020-07-08 DIAGNOSIS — M9904 Segmental and somatic dysfunction of sacral region: Secondary | ICD-10-CM | POA: Diagnosis not present

## 2020-07-08 DIAGNOSIS — M60831 Other myositis, right forearm: Secondary | ICD-10-CM | POA: Diagnosis not present

## 2020-07-27 DIAGNOSIS — M25512 Pain in left shoulder: Secondary | ICD-10-CM | POA: Diagnosis not present

## 2020-07-27 DIAGNOSIS — M9901 Segmental and somatic dysfunction of cervical region: Secondary | ICD-10-CM | POA: Diagnosis not present

## 2020-07-27 DIAGNOSIS — M60812 Other myositis, left shoulder: Secondary | ICD-10-CM | POA: Diagnosis not present

## 2020-07-27 DIAGNOSIS — M9902 Segmental and somatic dysfunction of thoracic region: Secondary | ICD-10-CM | POA: Diagnosis not present

## 2020-07-27 DIAGNOSIS — M62830 Muscle spasm of back: Secondary | ICD-10-CM | POA: Diagnosis not present

## 2020-07-29 DIAGNOSIS — H2513 Age-related nuclear cataract, bilateral: Secondary | ICD-10-CM | POA: Diagnosis not present

## 2020-08-03 DIAGNOSIS — M9903 Segmental and somatic dysfunction of lumbar region: Secondary | ICD-10-CM | POA: Diagnosis not present

## 2020-08-03 DIAGNOSIS — M9901 Segmental and somatic dysfunction of cervical region: Secondary | ICD-10-CM | POA: Diagnosis not present

## 2020-08-03 DIAGNOSIS — M60851 Other myositis, right thigh: Secondary | ICD-10-CM | POA: Diagnosis not present

## 2020-08-03 DIAGNOSIS — M62830 Muscle spasm of back: Secondary | ICD-10-CM | POA: Diagnosis not present

## 2020-08-03 DIAGNOSIS — M5451 Vertebrogenic low back pain: Secondary | ICD-10-CM | POA: Diagnosis not present

## 2020-08-03 DIAGNOSIS — M9902 Segmental and somatic dysfunction of thoracic region: Secondary | ICD-10-CM | POA: Diagnosis not present

## 2020-08-03 DIAGNOSIS — M25551 Pain in right hip: Secondary | ICD-10-CM | POA: Diagnosis not present

## 2020-08-23 DIAGNOSIS — M62830 Muscle spasm of back: Secondary | ICD-10-CM | POA: Diagnosis not present

## 2020-08-23 DIAGNOSIS — M60812 Other myositis, left shoulder: Secondary | ICD-10-CM | POA: Diagnosis not present

## 2020-08-23 DIAGNOSIS — M9904 Segmental and somatic dysfunction of sacral region: Secondary | ICD-10-CM | POA: Diagnosis not present

## 2020-08-23 DIAGNOSIS — M9901 Segmental and somatic dysfunction of cervical region: Secondary | ICD-10-CM | POA: Diagnosis not present

## 2020-08-23 DIAGNOSIS — M60862 Other myositis, left lower leg: Secondary | ICD-10-CM | POA: Diagnosis not present

## 2020-09-14 DIAGNOSIS — M62830 Muscle spasm of back: Secondary | ICD-10-CM | POA: Diagnosis not present

## 2020-09-14 DIAGNOSIS — M60852 Other myositis, left thigh: Secondary | ICD-10-CM | POA: Diagnosis not present

## 2020-09-14 DIAGNOSIS — M5451 Vertebrogenic low back pain: Secondary | ICD-10-CM | POA: Diagnosis not present

## 2020-09-14 DIAGNOSIS — M9904 Segmental and somatic dysfunction of sacral region: Secondary | ICD-10-CM | POA: Diagnosis not present

## 2020-09-14 DIAGNOSIS — M9903 Segmental and somatic dysfunction of lumbar region: Secondary | ICD-10-CM | POA: Diagnosis not present

## 2020-09-14 DIAGNOSIS — M60861 Other myositis, right lower leg: Secondary | ICD-10-CM | POA: Diagnosis not present

## 2020-09-14 DIAGNOSIS — M9901 Segmental and somatic dysfunction of cervical region: Secondary | ICD-10-CM | POA: Diagnosis not present

## 2020-10-05 DIAGNOSIS — D2262 Melanocytic nevi of left upper limb, including shoulder: Secondary | ICD-10-CM | POA: Diagnosis not present

## 2020-10-05 DIAGNOSIS — D485 Neoplasm of uncertain behavior of skin: Secondary | ICD-10-CM | POA: Diagnosis not present

## 2020-10-05 DIAGNOSIS — I788 Other diseases of capillaries: Secondary | ICD-10-CM | POA: Diagnosis not present

## 2020-10-05 DIAGNOSIS — D2271 Melanocytic nevi of right lower limb, including hip: Secondary | ICD-10-CM | POA: Diagnosis not present

## 2020-10-05 DIAGNOSIS — L918 Other hypertrophic disorders of the skin: Secondary | ICD-10-CM | POA: Diagnosis not present

## 2020-10-05 DIAGNOSIS — D2272 Melanocytic nevi of left lower limb, including hip: Secondary | ICD-10-CM | POA: Diagnosis not present

## 2020-10-05 DIAGNOSIS — D225 Melanocytic nevi of trunk: Secondary | ICD-10-CM | POA: Diagnosis not present

## 2020-10-05 DIAGNOSIS — L738 Other specified follicular disorders: Secondary | ICD-10-CM | POA: Diagnosis not present

## 2020-10-05 DIAGNOSIS — L821 Other seborrheic keratosis: Secondary | ICD-10-CM | POA: Diagnosis not present

## 2020-11-10 DIAGNOSIS — R922 Inconclusive mammogram: Secondary | ICD-10-CM | POA: Diagnosis not present

## 2020-11-10 DIAGNOSIS — C50912 Malignant neoplasm of unspecified site of left female breast: Secondary | ICD-10-CM | POA: Diagnosis not present

## 2020-11-10 DIAGNOSIS — Z853 Personal history of malignant neoplasm of breast: Secondary | ICD-10-CM | POA: Diagnosis not present

## 2020-11-16 DIAGNOSIS — C50912 Malignant neoplasm of unspecified site of left female breast: Secondary | ICD-10-CM | POA: Diagnosis not present

## 2020-11-16 DIAGNOSIS — Z170 Estrogen receptor positive status [ER+]: Secondary | ICD-10-CM | POA: Diagnosis not present

## 2020-11-16 LAB — BASIC METABOLIC PANEL
BUN: 17 (ref 4–21)
CO2: 22 (ref 13–22)
Chloride: 96 — AB (ref 99–108)
Creatinine: 1 (ref 0.5–1.1)
Glucose: 82
Potassium: 4.4 (ref 3.4–5.3)
Sodium: 132 — AB (ref 137–147)

## 2020-11-16 LAB — COMPREHENSIVE METABOLIC PANEL
Albumin: 4.5 (ref 3.5–5.0)
Calcium: 9.3 (ref 8.7–10.7)
GFR calc non Af Amer: 60
Globulin: 2.6

## 2020-11-16 LAB — CBC AND DIFFERENTIAL
HCT: 38 (ref 36–46)
Hemoglobin: 12.8 (ref 12.0–16.0)
Platelets: 169 (ref 150–399)
WBC: 5.1

## 2020-11-16 LAB — CBC: RBC: 4.23 (ref 3.87–5.11)

## 2020-11-16 LAB — HEPATIC FUNCTION PANEL
ALT: 17 (ref 7–35)
AST: 27 (ref 13–35)
Alkaline Phosphatase: 70 (ref 25–125)
Bilirubin, Total: 0.5

## 2020-11-16 LAB — VITAMIN D 25 HYDROXY (VIT D DEFICIENCY, FRACTURES): Vit D, 25-Hydroxy: 38.6

## 2020-11-24 ENCOUNTER — Encounter: Payer: Medicare PPO | Admitting: Internal Medicine

## 2020-12-06 ENCOUNTER — Other Ambulatory Visit: Payer: Self-pay

## 2020-12-06 ENCOUNTER — Ambulatory Visit (INDEPENDENT_AMBULATORY_CARE_PROVIDER_SITE_OTHER): Payer: Medicare PPO

## 2020-12-06 VITALS — BP 102/66 | HR 75 | Temp 98.1°F | Resp 16 | Ht 68.0 in | Wt 136.0 lb

## 2020-12-06 DIAGNOSIS — Z0000 Encounter for general adult medical examination without abnormal findings: Secondary | ICD-10-CM | POA: Diagnosis not present

## 2020-12-06 NOTE — Progress Notes (Signed)
Subjective:   Judy Sampson is a 75 y.o. female who presents for Medicare Annual (Subsequent) preventive examination.  Review of Systems     Cardiac Risk Factors include: advanced age (>25men, >76 women)     Objective:    Today's Vitals   12/06/20 0851  BP: 102/66  Pulse: 75  Resp: 16  Temp: 98.1 F (36.7 C)  TempSrc: Oral  SpO2: 99%  Weight: 136 lb (61.7 kg)  Height: 5\' 8"  (1.727 m)   Body mass index is 20.68 kg/m.  Advanced Directives 12/06/2020 12/03/2019 11/27/2018 11/21/2017 10/04/2016 11/16/2015  Does Patient Have a Medical Advance Directive? Yes Yes Yes No No No  Type of Paramedic of [place];Living will [place];Living will Living will;Healthcare Power of Attorney - - -  Copy of [place] in Chart? Yes - validated most recent copy scanned in chart (See row information) Yes - validated most recent copy scanned in chart (See row information) Yes - validated most recent copy scanned in chart (See row information) - - -  Would patient like information on creating a medical advance directive? - - - Yes (MAU/Ambulatory/Procedural Areas - Information given) - No - patient declined information    Current Medications (verified) Outpatient Encounter Medications as of 12/06/2020  Medication Sig  . Calcium-Magnesium-Vitamin D (CALCIUM MAGNESIUM PO) Take 4 tablets by mouth daily.   . Flaxseed, Linseed, (FLAX SEED OIL) 1000 MG CAPS Take by mouth.  . Omega-3 Fatty Acids (FISH OIL BURP-LESS) 1000 MG CAPS Take by mouth.  . sertraline (ZOLOFT) 50 MG tablet Take 1 tablet by mouth daily. (Patient taking differently: Take 1 tablet by mouth daily. Pt taking 25mg  daily.)  . valACYclovir (VALTREX) 1000 MG tablet Take 1 tablet (1,000 mg total) by mouth 2 (two) times daily as needed.  Marland Kitchen LORazepam (ATIVAN) 0.5 MG tablet Take 1 tablet (0.5 mg total) by mouth every 8 (eight) hours as needed for anxiety. (Patient not taking: Reported on  12/06/2020)  . [DISCONTINUED] Probiotic CAPS Take by mouth.  . [DISCONTINUED] TURMERIC PO Take 1 tablet by mouth daily.   No facility-administered encounter medications on file as of 12/06/2020.    Allergies (verified) Bisphosphonates   History: Past Medical History:  Diagnosis Date  . Anxiety   . Cancer ([place])    breast  . Claustrophobia   . Depression   . GERD (gastroesophageal reflux disease)   . H/O cold sores    Past Surgical History:  Procedure Laterality Date  . FOOT SURGERY Bilateral   . MASTECTOMY MODIFIED RADICAL Right 1998   XRT/Chemo + 5 yrs  Aromasin  . OOPHORECTOMY Left    Family History  Problem Relation Age of Onset  . Diabetes Mother   . Parkinson's disease Mother   . Breast cancer Mother        91  . Heart disease Father   . Parkinson's disease Brother    Social History   Socioeconomic History  . Marital status: Divorced    Spouse name: Not on file  . Number of children: 0  . Years of education: Not on file  . Highest education level: Master's degree (e.g., MA, MS, MEng, MEd, MSW, MBA)  Occupational History  . Occupation: Retired  Tobacco Use  . Smoking status: Never Smoker  . Smokeless tobacco: Never Used  . Tobacco comment: smoking cessation materials not required  Vaping Use  . Vaping Use: Never used  Substance and Sexual Activity  . Alcohol  use: Yes    Alcohol/week: 2.0 standard drinks    Types: 2 Standard drinks or equivalent per week    Comment: occasional  . Drug use: No  . Sexual activity: Not Currently  Other Topics Concern  . Not on file  Social History Narrative  . Not on file   Social Determinants of Health   Financial Resource Strain: Low Risk   . Difficulty of Paying Living Expenses: Not hard at all  Food Insecurity: No Food Insecurity  . Worried About Charity fundraiser in the Last Year: Never true  . Ran Out of Food in the Last Year: Never true  Transportation Needs: No Transportation Needs  . Lack of  Transportation (Medical): No  . Lack of Transportation (Non-Medical): No  Physical Activity: Sufficiently Active  . Days of Exercise per Week: 7 days  . Minutes of Exercise per Session: 40 min  Stress: No Stress Concern Present  . Feeling of Stress : Not at all  Social Connections: Moderately Isolated  . Frequency of Communication with Friends and Family: More than three times a week  . Frequency of Social Gatherings with Friends and Family: Three times a week  . Attends Religious Services: More than 4 times per year  . Active Member of Clubs or Organizations: No  . Attends Archivist Meetings: Never  . Marital Status: Divorced    Tobacco Counseling Counseling given: Not Answered Comment: smoking cessation materials not required   Clinical Intake:  Pre-visit preparation completed: Yes  Pain : No/denies pain     BMI - recorded: 20.68 Nutritional Status: BMI of 19-24  Normal Nutritional Risks: None Diabetes: No  How often do you need to have someone help you when you read instructions, pamphlets, or other written materials from your doctor or pharmacy?: 1 - Never    Interpreter Needed?: No  Information entered by :: Clemetine Marker LPN   Activities of Daily Living In your present state of health, do you have any difficulty performing the following activities: 12/06/2020  Hearing? N  Comment declines hearing aids  Vision? N  Difficulty concentrating or making decisions? N  Walking or climbing stairs? N  Dressing or bathing? N  Doing errands, shopping? N  Preparing Food and eating ? N  Using the Toilet? N  In the past six months, have you accidently leaked urine? Y  Comment wears liners for protection  Do you have problems with loss of bowel control? N  Managing your Medications? N  Managing your Finances? N  Housekeeping or managing your Housekeeping? N  Some recent data might be hidden    Patient Care Team: Glean Hess, MD as PCP - General  (Internal Medicine) Dr. Enos Fling (Obstetrics and Gynecology) Laveda Norman, MD as Consulting Physician (Hematology and Oncology) [facility] (Dermatology)  Indicate any recent Medical Services you may have received from other than [facility] providers in the past year (date may be approximate).     Assessment:   This is a routine wellness examination for [place].  Hearing/Vision screen  Hearing Screening   125Hz  250Hz  500Hz  1000Hz  2000Hz  3000Hz  4000Hz  6000Hz  8000Hz   Right ear:           Left ear:           Comments: Pt denies hearing difficulty  Vision Screening Comments: Annual vision screenings [facility]yne [facility]  Dietary issues and exercise activities discussed: Current Exercise Habits: Home exercise routine, Type of exercise:  walking;yoga, Time (Minutes): 40, Frequency (Times/Week): 7, Weekly Exercise (Minutes/Week): 280, Intensity: Moderate, Exercise limited by: None identified  Goals    . DIET - INCREASE WATER INTAKE     Recommend to drink at least 6-8 8oz glasses of water per day.     . Patient Stated     Continue healthy diet and exercise for cholesterol and weight management.       Depression Screen PHQ 2/9 Scores 12/06/2020 12/03/2019 11/20/2019 11/27/2018 11/14/2018 11/21/2017 10/04/2016  PHQ - 2 Score 0 0 0 0 0 0 0  PHQ- 9 Score - - 0 - - - -    Fall Risk Fall Risk  12/06/2020 12/03/2019 11/20/2019 11/27/2018 11/14/2018  Falls in the past year? 0 0 0 0 0  Number falls in past yr: 0 0 0 0 0  Injury with Fall? 0 0 0 - 0  Risk for fall due to : No Fall Risks No Fall Risks - - -  Follow up Falls prevention discussed Falls prevention discussed Falls evaluation completed Falls prevention discussed Falls evaluation completed    FALL RISK PREVENTION PERTAINING TO THE HOME:  Any stairs in or around the home? Yes  If so, are there any without handrails? No  Home free of loose throw rugs in walkways, pet beds, electrical cords, etc? Yes  Adequate  lighting in your home to reduce risk of falls? Yes   ASSISTIVE DEVICES UTILIZED TO PREVENT FALLS:  Life alert? No  Use of a cane, walker or w/c? No  Grab bars in the bathroom? No  Shower chair or bench in shower? No  Elevated toilet seat or a handicapped toilet? No   TIMED UP AND GO:  Was the test performed? Yes .  Length of time to ambulate 10 feet: 5 sec.   Gait steady and fast without use of assistive device  Cognitive Function: Normal cognitive status assessed by direct observation by this Nurse Health Advisor. No abnormalities found.       6CIT Screen 11/27/2018 11/21/2017 10/04/2016  What Year? 0 points 0 points 0 points  What month? 0 points 0 points 0 points  What time? 0 points 0 points 0 points  Count back from 20 0 points 0 points 0 points  Months in reverse 0 points 0 points 0 points  Repeat phrase 0 points 0 points 0 points  Total Score 0 0 0    Immunizations Immunization History  Administered Date(s) Administered  . Influenza, High Dose Seasonal PF 07/29/2020  . Influenza-Unspecified 07/30/2014, 07/01/2015, 08/21/2018, 07/20/2019  . PFIZER(Purple Top)SARS-COV-2 Vaccination 10/15/2019, 11/05/2019, 07/29/2020  . Pneumococcal Conjugate-13 11/19/2014  . Pneumococcal Polysaccharide-23 11/16/2015  . Pneumococcal-Unspecified 07/30/2017  . Tdap 08/31/2015, 10/29/2018  . Zoster 11/01/2007, 08/21/2017, 01/10/2019  . Zoster Recombinat (Shingrix) 06/06/2018, 08/13/2018     TDAP status: Up to date    Flu Vaccine status: Up to date  Pneumococcal vaccine status: Up to date  Covid-19 vaccine status: Completed vaccines  Qualifies for Shingles Vaccine? Yes   Zostavax completed Yes   Shingrix Completed?: Yes  Screening Tests Health Maintenance  Topic Date Due  . COVID-19 Vaccine (4 - Booster for Pfizer series) 01/26/2021  . MAMMOGRAM  11/10/2021  . COLONOSCOPY (Pts 45-60yrs Insurance coverage will need to be confirmed)  12/24/2022  . TETANUS/TDAP  10/29/2028  .  INFLUENZA VACCINE  Completed  . DEXA SCAN  Completed  . Hepatitis C Screening  Completed  . PNA vac Low Risk Adult  Completed  Health Maintenance  There are no preventive care reminders to display for this patient.  Colorectal cancer screening: Type of screening: Colonoscopy. Completed 12/24/17. Repeat every 5 years  Mammogram status: Completed 11/10/20. Repeat every year. Ordered by Dr. [facility] Radiology; need records.   Bone Density status: Completed 11/21/19. Results reflect: Bone density results: OSTEOPOROSIS. Repeat every 2 years. pt received zometa infusion.   Lung Cancer Screening: (Low Dose CT Chest recommended if Age 29-80 years, 30 pack-year currently smoking OR have quit w/in 15years.) does not qualify.   Additional Screening:  Hepatitis C Screening: does qualify; Completed 10/04/16  Vision Screening: Recommended annual ophthalmology exams for early detection of glaucoma and other disorders of the eye. Is the patient up to date with their annual eye exam?  Yes  Who is the provider or what is the name of the office in which the patient attends annual eye exams? Dr. Marina Gravel  Dental Screening: Recommended annual dental exams for proper oral hygiene  Community Resource Referral / Chronic Care Management: CRR required this visit?  No   CCM required this visit?  No      Plan:     I have personally reviewed and noted the following in the patient's chart:   . Medical and social history . Use of alcohol, tobacco or illicit drugs  . Current medications and supplements . Functional ability and status . Nutritional status . Physical activity . Advanced directives . List of other physicians . Hospitalizations, surgeries, and ER visits in previous 12 months . Vitals . Screenings to include cognitive, depression, and falls . Referrals and appointments  In addition, I have reviewed and discussed with patient certain preventive protocols, quality metrics, and best  practice recommendations. A written personalized care plan for preventive services as well as general preventive health recommendations were provided to patient.     Clemetine Marker, LPN   [id number]   Nurse Notes: pt states she had labs done recently by oncologist. Pt advised to bring copy of lab reports to next appt.

## 2020-12-06 NOTE — Patient Instructions (Signed)
Judy Sampson , Thank you for taking time to come for your Medicare Wellness Visit. I appreciate your ongoing commitment to your health goals. Please review the following plan we discussed and let me know if I can assist you in the future.   Screening recommendations/referrals: Colonoscopy: done 12/24/17. Repeat in 2024 Mammogram: done 11/10/20 Bone Density: done 11/21/19 Recommended yearly ophthalmology/optometry visit for glaucoma screening and checkup Recommended yearly dental visit for hygiene and checkup  Vaccinations: Influenza vaccine: done 07/29/20 Pneumococcal vaccine: done 07/30/17 Tdap vaccine: done 10/29/18 Shingles vaccine: done 06/06/18 & 08/13/18   Covid-19: done 10/15/19, 11/05/19 & 07/29/20  Conditions/risks identified: Keep up the great work!  Next appointment: Follow up in one year for your annual wellness visit    Preventive Care 65 Years and Older, Female Preventive care refers to lifestyle choices and visits with your health care provider that can promote health and wellness. What does preventive care include?  A yearly physical exam. This is also called an annual well check.  Dental exams once or twice a year.  Routine eye exams. Ask your health care provider how often you should have your eyes checked.  Personal lifestyle choices, including:  Daily care of your teeth and gums.  Regular physical activity.  Eating a healthy diet.  Avoiding tobacco and drug use.  Limiting alcohol use.  Practicing safe sex.  Taking low-dose aspirin every day.  Taking vitamin and mineral supplements as recommended by your health care provider. What happens during an annual well check? The services and screenings done by your health care provider during your annual well check will depend on your age, overall health, lifestyle risk factors, and family history of disease. Counseling  Your health care provider may ask you questions about your:  Alcohol use.  Tobacco  use.  Drug use.  Emotional well-being.  Home and relationship well-being.  Sexual activity.  Eating habits.  History of falls.  Memory and ability to understand (cognition).  Work and work Statistician.  Reproductive health. Screening  You may have the following tests or measurements:  Height, weight, and BMI.  Blood pressure.  Lipid and cholesterol levels. These may be checked every 5 years, or more frequently if you are over 26 years old.  Skin check.  Lung cancer screening. You may have this screening every year starting at age 73 if you have a 30-pack-year history of smoking and currently smoke or have quit within the past 15 years.  Fecal occult blood test (FOBT) of the stool. You may have this test every year starting at age 73.  Flexible sigmoidoscopy or colonoscopy. You may have a sigmoidoscopy every 5 years or a colonoscopy every 10 years starting at age 27.  Hepatitis C blood test.  Hepatitis B blood test.  Sexually transmitted disease (STD) testing.  Diabetes screening. This is done by checking your blood sugar (glucose) after you have not eaten for a while (fasting). You may have this done every 1-3 years.  Bone density scan. This is done to screen for osteoporosis. You may have this done starting at age 73.  Mammogram. This may be done every 1-2 years. Talk to your health care provider about how often you should have regular mammograms. Talk with your health care provider about your test results, treatment options, and if necessary, the need for more tests. Vaccines  Your health care provider may recommend certain vaccines, such as:  Influenza vaccine. This is recommended every year.  Tetanus, diphtheria, and acellular pertussis (Tdap,  Td) vaccine. You may need a Td booster every 10 years.  Zoster vaccine. You may need this after age 73.  Pneumococcal 13-valent conjugate (PCV13) vaccine. One dose is recommended after age 73.  Pneumococcal  polysaccharide (PPSV23) vaccine. One dose is recommended after age 73. Talk to your health care provider about which screenings and vaccines you need and how often you need them. This information is not intended to replace advice given to you by your health care provider. Make sure you discuss any questions you have with your health care provider. Document Released: 11/12/2015 Document Revised: 07/05/2016 Document Reviewed: 08/17/2015 Elsevier Interactive Patient Education  2017 [place] Prevention in the Home Falls can cause injuries. They can happen to people of all ages. There are many things you can do to make your home safe and to help prevent falls. What can I do on the outside of my home?  Regularly fix the edges of walkways and driveways and fix any cracks.  Remove anything that might make you trip as you walk through a door, such as a raised step or threshold.  Trim any bushes or trees on the path to your home.  Use bright outdoor lighting.  Clear any walking paths of anything that might make someone trip, such as rocks or tools.  Regularly check to see if handrails are loose or broken. Make sure that both sides of any steps have handrails.  Any raised decks and porches should have guardrails on the edges.  Have any leaves, snow, or ice cleared regularly.  Use sand or salt on walking paths during winter.  Clean up any spills in your garage right away. This includes oil or grease spills. What can I do in the bathroom?  Use night lights.  Install grab bars by the toilet and in the tub and shower. Do not use towel bars as grab bars.  Use non-skid mats or decals in the tub or shower.  If you need to sit down in the shower, use a plastic, non-slip stool.  Keep the floor dry. Clean up any water that spills on the floor as soon as it happens.  Remove soap buildup in the tub or shower regularly.  Attach bath mats securely with double-sided non-slip rug  tape.  Do not have throw rugs and other things on the floor that can make you trip. What can I do in the bedroom?  Use night lights.  Make sure that you have a light by your bed that is easy to reach.  Do not use any sheets or blankets that are too big for your bed. They should not hang down onto the floor.  Have a firm chair that has side arms. You can use this for support while you get dressed.  Do not have throw rugs and other things on the floor that can make you trip. What can I do in the kitchen?  Clean up any spills right away.  Avoid walking on wet floors.  Keep items that you use a lot in easy-to-reach places.  If you need to reach something above you, use a strong step stool that has a grab bar.  Keep electrical cords out of the way.  Do not use floor polish or wax that makes floors slippery. If you must use wax, use non-skid floor wax.  Do not have throw rugs and other things on the floor that can make you trip. What can I do with my stairs?  Do not  leave any items on the stairs.  Make sure that there are handrails on both sides of the stairs and use them. Fix handrails that are broken or loose. Make sure that handrails are as long as the stairways.  Check any carpeting to make sure that it is firmly attached to the stairs. Fix any carpet that is loose or worn.  Avoid having throw rugs at the top or bottom of the stairs. If you do have throw rugs, attach them to the floor with carpet tape.  Make sure that you have a light switch at the top of the stairs and the bottom of the stairs. If you do not have them, ask someone to add them for you. What else can I do to help prevent falls?  Wear shoes that:  Do not have high heels.  Have rubber bottoms.  Are comfortable and fit you well.  Are closed at the toe. Do not wear sandals.  If you use a stepladder:  Make sure that it is fully opened. Do not climb a closed stepladder.  Make sure that both sides of the  stepladder are locked into place.  Ask someone to hold it for you, if possible.  Clearly mark and make sure that you can see:  Any grab bars or handrails.  First and last steps.  Where the edge of each step is.  Use tools that help you move around (mobility aids) if they are needed. These include:  Canes.  Walkers.  Scooters.  Crutches.  Turn on the lights when you go into a dark area. Replace any light bulbs as soon as they burn out.  Set up your furniture so you have a clear path. Avoid moving your furniture around.  If any of your floors are uneven, fix them.  If there are any pets around you, be aware of where they are.  Review your medicines with your doctor. Some medicines can make you feel dizzy. This can increase your chance of falling. Ask your doctor what other things that you can do to help prevent falls. This information is not intended to replace advice given to you by your health care provider. Make sure you discuss any questions you have with your health care provider. Document Released: 08/12/2009 Document Revised: 03/23/2016 Document Reviewed: 11/20/2014 Elsevier Interactive Patient Education  2017 [employer].

## 2020-12-20 ENCOUNTER — Other Ambulatory Visit: Payer: Self-pay | Admitting: Internal Medicine

## 2020-12-20 DIAGNOSIS — F064 Anxiety disorder due to known physiological condition: Secondary | ICD-10-CM

## 2020-12-22 ENCOUNTER — Encounter: Payer: Self-pay | Admitting: Internal Medicine

## 2020-12-22 ENCOUNTER — Other Ambulatory Visit: Payer: Self-pay

## 2020-12-22 ENCOUNTER — Ambulatory Visit (INDEPENDENT_AMBULATORY_CARE_PROVIDER_SITE_OTHER): Payer: Medicare PPO | Admitting: Internal Medicine

## 2020-12-22 VITALS — BP 130/82 | HR 63 | Temp 97.5°F | Ht 68.0 in | Wt 139.0 lb

## 2020-12-22 DIAGNOSIS — Z0000 Encounter for general adult medical examination without abnormal findings: Secondary | ICD-10-CM

## 2020-12-22 DIAGNOSIS — E871 Hypo-osmolality and hyponatremia: Secondary | ICD-10-CM | POA: Diagnosis not present

## 2020-12-22 DIAGNOSIS — C50912 Malignant neoplasm of unspecified site of left female breast: Secondary | ICD-10-CM | POA: Diagnosis not present

## 2020-12-22 DIAGNOSIS — E785 Hyperlipidemia, unspecified: Secondary | ICD-10-CM | POA: Diagnosis not present

## 2020-12-22 DIAGNOSIS — F411 Generalized anxiety disorder: Secondary | ICD-10-CM | POA: Diagnosis not present

## 2020-12-22 HISTORY — DX: Malignant neoplasm of unspecified site of left female breast: C50.912

## 2020-12-22 MED ORDER — SCOPOLAMINE 1 MG/3DAYS TD PT72: 1.0000 | MEDICATED_PATCH | TRANSDERMAL | 0 refills | Status: DC

## 2020-12-22 MED ORDER — SERTRALINE HCL 50 MG PO TABS: 50.0000 mg | ORAL_TABLET | Freq: Every day | ORAL | 3 refills | Status: DC

## 2020-12-22 NOTE — Progress Notes (Signed)
Date:  12/22/2020   Name:  Judy Sampson   DOB:  14-Mar-1946   MRN:  [id number]   Chief Complaint: Annual Exam (Just had mammo last month. No breast exam. No pap-discontinued. )  Judy Sampson is a 75 y.o. female who presents today for her Complete Annual Exam. She feels well. She reports exercising - walking everyday at least a mile, and yoga/ strength 3 x weekly. She reports she is sleeping well. Breast complaints - none.  Recent oncology visit with extensive labs.  Mammogram: 10/2020 DEXA: 10/2019 result not available Colonoscopy: 11/2017 one polyp repeat 2024  Immunization History  Administered Date(s) Administered  . Influenza, High Dose Seasonal PF 07/29/2020  . Influenza-Unspecified 07/30/2014, 07/01/2015, 08/21/2018, 07/20/2019  . PFIZER(Purple Top)SARS-COV-2 Vaccination 10/15/2019, 11/05/2019, 07/29/2020  . Pneumococcal Conjugate-13 11/19/2014  . Pneumococcal Polysaccharide-23 11/16/2015  . Pneumococcal-Unspecified 07/30/2017  . Tdap 08/31/2015, 10/29/2018  . Zoster 11/01/2007, 08/21/2017, 01/10/2019  . Zoster Recombinat (Shingrix) 06/06/2018, 08/13/2018    Anxiety Presents for follow-up visit. Patient reports no chest pain, dizziness, nervous/anxious behavior, palpitations or shortness of breath. Symptoms occur occasionally. The severity of symptoms is mild.   Compliance with medications is 76-100% (zoloft).    Lab Results  Component Value Date   CREATININE 1.0 11/16/2020   BUN 17 11/16/2020   NA 132 (A) 11/16/2020   K 4.4 11/16/2020   CL 96 (A) 11/16/2020   CO2 22 11/16/2020   Lab Results  Component Value Date   CHOL 235 (H) 11/20/2019   HDL 80 11/20/2019   LDLCALC 145 (H) 11/20/2019   TRIG 59 11/20/2019   CHOLHDL 2.9 11/20/2019   Lab Results  Component Value Date   TSH 1.580 11/20/2019   No results found for: HGBA1C Lab Results  Component Value Date   WBC 5.1 11/16/2020   HGB 12.8 11/16/2020   HCT 38 11/16/2020   MCV 89 11/20/2019   PLT 169  11/16/2020   Lab Results  Component Value Date   ALT 17 11/16/2020   AST 27 11/16/2020   ALKPHOS 70 11/16/2020   BILITOT 0.3 11/20/2019   Last vitamin D Lab Results  Component Value Date   VD25OH 38.6 11/16/2020      Review of Systems  Constitutional: Negative for chills, fatigue and fever.  HENT: Negative for congestion, hearing loss, tinnitus, trouble swallowing and voice change.   Eyes: Negative for visual disturbance.  Respiratory: Negative for cough, chest tightness, shortness of breath and wheezing.   Cardiovascular: Negative for chest pain, palpitations and leg swelling.  Gastrointestinal: Negative for abdominal pain, constipation, diarrhea and vomiting.  Endocrine: Negative for polydipsia and polyuria.  Genitourinary: Negative for dysuria, frequency, genital sores, vaginal bleeding and vaginal discharge.  Musculoskeletal: Negative for arthralgias, gait problem and joint swelling.  Skin: Negative for color change and rash.  Neurological: Negative for dizziness, tremors, light-headedness and headaches.  Hematological: Negative for adenopathy. Does not bruise/bleed easily.  Psychiatric/Behavioral: Negative for dysphoric mood and sleep disturbance. The patient is not nervous/anxious.     Patient Active Problem List   Diagnosis Date Noted  . Mild hyperlipidemia 12/22/2020  . Generalized anxiety disorder 11/20/2019  . Vitamin D deficiency 11/20/2019  . Adenomatous polyp of colon 07/03/2017  . Osteopenia determined by x-ray 10/04/2016  . Muscle spasms of neck 10/04/2016  . Cardiac arrhythmia 10/04/2016  . Hx of herpes genitalis 05/26/2015  . Claustrophobia 05/26/2015  . Acid reflux 05/26/2015  . Personal history of malignant neoplasm of breast 10/30/1996  Allergies  Allergen Reactions  . Bisphosphonates     Past Surgical History:  Procedure Laterality Date  . FOOT SURGERY Bilateral   . MASTECTOMY MODIFIED RADICAL Right 1998   XRT/Chemo + 5 yrs  Aromasin  .  OOPHORECTOMY Left     Social History   Tobacco Use  . Smoking status: Never Smoker  . Smokeless tobacco: Never Used  . Tobacco comment: smoking cessation materials not required  Vaping Use  . Vaping Use: Never used  Substance Use Topics  . Alcohol use: Yes    Alcohol/week: 2.0 standard drinks    Types: 2 Standard drinks or equivalent per week    Comment: occasional  . Drug use: No     Medication list has been reviewed and updated.  Current Meds  Medication Sig  . Calcium-Magnesium-Vitamin D (CALCIUM MAGNESIUM PO) Take 4 tablets by mouth daily.   . Flaxseed, Linseed, (FLAX SEED OIL) 1000 MG CAPS Take by mouth.  . Omega-3 Fatty Acids (FISH OIL BURP-LESS) 1000 MG CAPS Take by mouth.  . sertraline (ZOLOFT) 50 MG tablet TAKE 1 TABLET BY MOUTH DAILY (Patient taking differently: Take 25 mg by mouth daily. Take 0.5 tablet by mouth daily.)  . valACYclovir (VALTREX) 1000 MG tablet Take 1 tablet (1,000 mg total) by mouth 2 (two) times daily as needed.    PHQ 2/9 Scores 12/22/2020 12/06/2020 12/03/2019 11/20/2019  PHQ - 2 Score 0 0 0 0  PHQ- 9 Score 0 - - 0    GAD 7 : Generalized Anxiety Score 12/22/2020 05/15/2016  Nervous, Anxious, on Edge 0 3  Control/stop worrying 0 3  Worry too much - different things 0 3  Trouble relaxing 0 0  Restless 0 0  Easily annoyed or irritable 0 0  Afraid - awful might happen 0 0  Total GAD 7 Score 0 9  Anxiety Difficulty Not difficult at all -    BP Readings from Last 3 Encounters:  12/22/20 130/82  12/06/20 102/66  11/20/19 112/70    Physical Exam Vitals and nursing note reviewed.  Constitutional:      General: She is not in acute distress.    Appearance: She is well-developed.  HENT:     Head: Normocephalic and atraumatic.     Right Ear: Tympanic membrane and ear canal normal.     Left Ear: Tympanic membrane and ear canal normal.     Nose:     Right Sinus: No maxillary sinus tenderness.     Left Sinus: No maxillary sinus tenderness.   Eyes:     General: No scleral icterus.       Right eye: No discharge.        Left eye: No discharge.     Conjunctiva/sclera: Conjunctivae normal.  Neck:     Thyroid: No thyromegaly.     Vascular: No carotid bruit.  Cardiovascular:     Rate and Rhythm: Normal rate and regular rhythm.     Pulses: Normal pulses.     Heart sounds: Normal heart sounds.  Pulmonary:     Effort: Pulmonary effort is normal. No respiratory distress.     Breath sounds: No wheezing.  Abdominal:     General: Bowel sounds are normal.     Palpations: Abdomen is soft.     Tenderness: There is no abdominal tenderness.  Musculoskeletal:     Cervical back: Normal range of motion. No erythema.     Right lower leg: No edema.     Left lower  leg: No edema.  Lymphadenopathy:     Cervical: No cervical adenopathy.  Skin:    General: Skin is warm and dry.     Findings: No rash.  Neurological:     Mental Status: She is alert and oriented to person, place, and time.     Cranial Nerves: No cranial nerve deficit.     Sensory: No sensory deficit.     Deep Tendon Reflexes: Reflexes are normal and symmetric.  Psychiatric:        Attention and Perception: Attention normal.        Mood and Affect: Mood normal.     Wt Readings from Last 3 Encounters:  12/22/20 139 lb (63 kg)  12/06/20 136 lb (61.7 kg)  12/03/19 136 lb (61.7 kg)    BP 130/82   Pulse 63   Temp (!) 97.5 F (36.4 C) (Oral)   Ht 5\' 8"  (1.727 m)   Wt 139 lb (63 kg)   SpO2 100%   BMI 21.13 kg/m   Assessment and Plan: 1. Annual physical exam Normal exam Continue healthy diet, exercise UTD on immunizations and screenings  2. Generalized anxiety disorder Doing well on low dose sertraline - 25 mg per day - sertraline (ZOLOFT) 50 MG tablet; Take 1 tablet (50 mg total) by mouth daily.  Dispense: 90 tablet; Refill: 3 - TSH  3. Mild hyperlipidemia Labs have been borderline - will check and advise if CAD risk is elevated. - Lipid panel  4.  Hyponatremia Liberalize dietary sodium  5. Malignant neoplasm of left female breast, unspecified estrogen receptor status, unspecified site of breast ([place]) Followed closely by Oncology   Partially dictated using Dragon software. Any errors are unintentional.  Halina Maidens, MD [facility]  12/22/2020

## 2020-12-23 LAB — LIPID PANEL
Chol/HDL Ratio: 2.8 ratio (ref 0.0–4.4)
Cholesterol, Total: 247 mg/dL — ABNORMAL HIGH (ref 100–199)
HDL: 87 mg/dL (ref >39)
LDL Chol Calc (NIH): 152 mg/dL — ABNORMAL HIGH (ref 0–99)
Triglycerides: 50 mg/dL (ref 0–149)
VLDL Cholesterol Cal: 8 mg/dL (ref 5–40)

## 2020-12-23 LAB — TSH: TSH: 1.15 u[IU]/mL (ref 0.450–4.500)

## 2021-01-29 DIAGNOSIS — Z23 Encounter for immunization: Secondary | ICD-10-CM | POA: Diagnosis not present

## 2021-02-09 ENCOUNTER — Other Ambulatory Visit: Payer: Self-pay | Admitting: Internal Medicine

## 2021-02-09 DIAGNOSIS — F411 Generalized anxiety disorder: Secondary | ICD-10-CM

## 2021-03-16 DIAGNOSIS — M9903 Segmental and somatic dysfunction of lumbar region: Secondary | ICD-10-CM | POA: Diagnosis not present

## 2021-03-16 DIAGNOSIS — M62830 Muscle spasm of back: Secondary | ICD-10-CM | POA: Diagnosis not present

## 2021-03-16 DIAGNOSIS — M9901 Segmental and somatic dysfunction of cervical region: Secondary | ICD-10-CM | POA: Diagnosis not present

## 2021-03-16 DIAGNOSIS — M9902 Segmental and somatic dysfunction of thoracic region: Secondary | ICD-10-CM | POA: Diagnosis not present

## 2021-03-16 DIAGNOSIS — M25561 Pain in right knee: Secondary | ICD-10-CM | POA: Diagnosis not present

## 2021-03-16 DIAGNOSIS — Z853 Personal history of malignant neoplasm of breast: Secondary | ICD-10-CM | POA: Diagnosis not present

## 2021-03-16 DIAGNOSIS — M9904 Segmental and somatic dysfunction of sacral region: Secondary | ICD-10-CM | POA: Diagnosis not present

## 2021-03-16 DIAGNOSIS — M60811 Other myositis, right shoulder: Secondary | ICD-10-CM | POA: Diagnosis not present

## 2021-03-25 ENCOUNTER — Ambulatory Visit: Payer: Medicare PPO | Admitting: Internal Medicine

## 2021-03-25 ENCOUNTER — Encounter: Payer: Self-pay | Admitting: Internal Medicine

## 2021-03-25 ENCOUNTER — Other Ambulatory Visit: Payer: Self-pay

## 2021-03-25 VITALS — BP 124/86 | HR 75 | Temp 98.3°F | Ht 68.0 in | Wt 129.0 lb

## 2021-03-25 DIAGNOSIS — R110 Nausea: Secondary | ICD-10-CM | POA: Diagnosis not present

## 2021-03-25 DIAGNOSIS — G4440 Drug-induced headache, not elsewhere classified, not intractable: Secondary | ICD-10-CM

## 2021-03-25 MED ORDER — ONDANSETRON 4 MG PO TBDP: 4.0000 mg | ORAL_TABLET | Freq: Three times a day (TID) | ORAL | 0 refills | Status: DC | PRN

## 2021-03-25 NOTE — Progress Notes (Signed)
Date:  03/25/2021   Name:  Judy Sampson   DOB:  [birth year]   MRN:  [id number]   Chief Complaint: Nausea (X5 days, Headache)  Headache  This is a new problem. The current episode started in the past 7 days. The problem occurs constantly. The pain is located in the parietal and frontal region. The pain does not radiate. The pain quality is not similar to prior headaches. The pain is moderate. Associated symptoms include nausea. Pertinent negatives include no dizziness, fever, numbness or weakness. Exacerbated by: started when she took off a scopolamine patch. She has tried nothing for the symptoms.    Lab Results  Component Value Date   CREATININE 1.0 11/16/2020   BUN 17 11/16/2020   NA 132 (A) 11/16/2020   K 4.4 11/16/2020   CL 96 (A) 11/16/2020   CO2 22 11/16/2020   Lab Results  Component Value Date   CHOL 247 (H) 12/22/2020   HDL 87 12/22/2020   LDLCALC 152 (H) 12/22/2020   TRIG 50 12/22/2020   CHOLHDL 2.8 12/22/2020   Lab Results  Component Value Date   TSH 1.150 12/22/2020   No results found for: HGBA1C Lab Results  Component Value Date   WBC 5.1 11/16/2020   HGB 12.8 11/16/2020   HCT 38 11/16/2020   MCV 89 11/20/2019   PLT 169 11/16/2020   Lab Results  Component Value Date   ALT 17 11/16/2020   AST 27 11/16/2020   ALKPHOS 70 11/16/2020   BILITOT 0.3 11/20/2019     Review of Systems  Constitutional: Negative for chills, fatigue and fever.  Respiratory: Negative for chest tightness and shortness of breath.   Cardiovascular: Negative for chest pain and palpitations.  Gastrointestinal: Positive for nausea.  Neurological: Positive for headaches. Negative for dizziness, syncope, weakness, light-headedness and numbness.    Patient Active Problem List   Diagnosis Date Noted  . Mild hyperlipidemia 12/22/2020  . Malignant neoplasm of left female breast, unspecified estrogen receptor status, unspecified site of breast ([place]) 12/22/2020  . Generalized anxiety  disorder 11/20/2019  . Vitamin D deficiency 11/20/2019  . Adenomatous polyp of colon 07/03/2017  . Osteopenia determined by x-ray 10/04/2016  . Muscle spasms of neck 10/04/2016  . Cardiac arrhythmia 10/04/2016  . Hx of herpes genitalis 05/26/2015  . Claustrophobia 05/26/2015  . Acid reflux 05/26/2015  . Personal history of malignant neoplasm of breast 10/30/1996    Allergies  Allergen Reactions  . Scopolamine Nausea And Vomiting    headache  . Bisphosphonates     Past Surgical History:  Procedure Laterality Date  . FOOT SURGERY Bilateral   . MASTECTOMY MODIFIED RADICAL Right 1998   XRT/Chemo + 5 yrs  Aromasin  . OOPHORECTOMY Left     Social History   Tobacco Use  . Smoking status: Never Smoker  . Smokeless tobacco: Never Used  . Tobacco comment: smoking cessation materials not required  Vaping Use  . Vaping Use: Never used  Substance Use Topics  . Alcohol use: Yes    Alcohol/week: 2.0 standard drinks    Types: 2 Standard drinks or equivalent per week    Comment: occasional  . Drug use: No     Medication list has been reviewed and updated.  Current Meds  Medication Sig  . Calcium-Magnesium-Vitamin D (CALCIUM MAGNESIUM PO) Take 4 tablets by mouth daily.   . Flaxseed, Linseed, (FLAX SEED OIL) 1000 MG CAPS Take by mouth.  . Omega-3 Fatty Acids ([place])  1000 MG CAPS Take by mouth.  . sertraline (ZOLOFT) 50 MG tablet Take 1 tablet (50 mg total) by mouth daily.  . valACYclovir (VALTREX) 1000 MG tablet Take 1 tablet (1,000 mg total) by mouth 2 (two) times daily as needed.    PHQ 2/9 Scores 03/25/2021 12/22/2020 12/06/2020 12/03/2019  PHQ - 2 Score 0 0 0 0  PHQ- 9 Score 0 0 - -    GAD 7 : Generalized Anxiety Score 03/25/2021 12/22/2020 05/15/2016  Nervous, Anxious, on Edge 1 0 3  Control/stop worrying 0 0 3  Worry too much - different things 0 0 3  Trouble relaxing 0 0 0  Restless 0 0 0  Easily annoyed or irritable 0 0 0  Afraid - awful might happen 0 0 0   Total GAD 7 Score 1 0 9  Anxiety Difficulty - Not difficult at all -    BP Readings from Last 3 Encounters:  03/25/21 124/86  12/22/20 130/82  12/06/20 102/66    Physical Exam Vitals and nursing note reviewed.  Constitutional:      General: She is not in acute distress.    Appearance: Normal appearance. She is well-developed.  HENT:     Head: Normocephalic and atraumatic.  Cardiovascular:     Rate and Rhythm: Normal rate and regular rhythm.     Pulses: Normal pulses.  Pulmonary:     Effort: Pulmonary effort is normal. No respiratory distress.  Musculoskeletal:     Cervical back: Normal range of motion.     Right lower leg: No edema.     Left lower leg: No edema.  Lymphadenopathy:     Cervical: No cervical adenopathy.  Skin:    General: Skin is warm and dry.     Findings: No rash.  Neurological:     General: No focal deficit present.     Mental Status: She is alert and oriented to person, place, and time.     Cranial Nerves: Cranial nerves are intact.     Motor: Motor function is intact.     Coordination: Coordination is intact.     Gait: Gait is intact.     Deep Tendon Reflexes:     Reflex Scores:      Bicep reflexes are 2+ on the right side and 2+ on the left side. Psychiatric:        Mood and Affect: Mood normal.        Behavior: Behavior normal.     Wt Readings from Last 3 Encounters:  03/25/21 129 lb (58.5 kg)  12/22/20 139 lb (63 kg)  12/06/20 136 lb (61.7 kg)    BP 124/86   Pulse 75   Temp 98.3 F (36.8 C) (Oral)   Ht 5\' 8"  (1.727 m)   Wt 129 lb (58.5 kg)   SpO2 96%   BMI 19.61 kg/m   Assessment and Plan: 1. Drug-induced headache, not elsewhere classified, not intractable Headache is now receding and is mostly resolved No associated neurological symptoms No further evaluation needed at this time - follow up if HA worsens   2. Nausea Use zofran PRN - ondansetron (ZOFRAN ODT) 4 MG disintegrating tablet; Take 1 tablet (4 mg total) by mouth  every 8 (eight) hours as needed for nausea or vomiting.  Dispense: 20 tablet; Refill: 0   Partially dictated using Editor, commissioning. Any errors are unintentional.  Halina Maidens, MD [facility]  03/25/2021

## 2021-04-11 DIAGNOSIS — M62830 Muscle spasm of back: Secondary | ICD-10-CM | POA: Diagnosis not present

## 2021-04-11 DIAGNOSIS — M9901 Segmental and somatic dysfunction of cervical region: Secondary | ICD-10-CM | POA: Diagnosis not present

## 2021-04-11 DIAGNOSIS — M60822 Other myositis, left upper arm: Secondary | ICD-10-CM | POA: Diagnosis not present

## 2021-04-11 DIAGNOSIS — T7840XS Allergy, unspecified, sequela: Secondary | ICD-10-CM | POA: Diagnosis not present

## 2021-04-11 DIAGNOSIS — M9904 Segmental and somatic dysfunction of sacral region: Secondary | ICD-10-CM | POA: Diagnosis not present

## 2021-06-27 DIAGNOSIS — Z823 Family history of stroke: Secondary | ICD-10-CM | POA: Diagnosis not present

## 2021-06-27 DIAGNOSIS — R32 Unspecified urinary incontinence: Secondary | ICD-10-CM | POA: Diagnosis not present

## 2021-06-27 DIAGNOSIS — F411 Generalized anxiety disorder: Secondary | ICD-10-CM | POA: Diagnosis not present

## 2021-06-27 DIAGNOSIS — H269 Unspecified cataract: Secondary | ICD-10-CM | POA: Diagnosis not present

## 2021-06-27 DIAGNOSIS — Z853 Personal history of malignant neoplasm of breast: Secondary | ICD-10-CM | POA: Diagnosis not present

## 2021-06-27 DIAGNOSIS — Z809 Family history of malignant neoplasm, unspecified: Secondary | ICD-10-CM | POA: Diagnosis not present

## 2021-06-27 DIAGNOSIS — Z833 Family history of diabetes mellitus: Secondary | ICD-10-CM | POA: Diagnosis not present

## 2021-06-27 DIAGNOSIS — I499 Cardiac arrhythmia, unspecified: Secondary | ICD-10-CM | POA: Diagnosis not present

## 2021-06-30 ENCOUNTER — Other Ambulatory Visit: Payer: Self-pay | Admitting: Internal Medicine

## 2021-06-30 DIAGNOSIS — B009 Herpesviral infection, unspecified: Secondary | ICD-10-CM

## 2021-06-30 NOTE — Telephone Encounter (Signed)
Requested medication (s) are due for refill today: Yes  Requested medication (s) are on the active medication list: Yes  Last refill:  06/03/20  Future visit scheduled:Yes  Notes to clinic:  Unable to refill per protocol, Rx expired.      Requested Prescriptions  Pending Prescriptions Disp Refills   valACYclovir (VALTREX) 1000 MG tablet 30 tablet 0    Sig: Take 1 tablet (1,000 mg total) by mouth 2 (two) times daily as needed.     Antimicrobials:  Antiviral Agents - Anti-Herpetic Passed - 06/30/2021  4:46 PM      Passed - Valid encounter within last 12 months    Recent Outpatient Visits           3 months ago Drug-induced headache, not elsewhere classified, not intractable   [facility] Glean Hess, MD   6 months ago Annual physical exam   [facility] Glean Hess, MD   1 year ago Annual physical exam   [facility] Glean Hess, MD   2 years ago Annual physical exam   [facility] Glean Hess, MD   3 years ago Annual physical exam   [facility] Glean Hess, MD       Future Appointments             In 6 months Army Melia Jesse Sans, MD [facility]

## 2021-06-30 NOTE — Telephone Encounter (Signed)
Copied from [place] [phone]. Topic: Quick Communication - Rx Refill/Question >> Jun 30, 2021  8:26 AM Leward Quan A wrote: Medication: valACYclovir (VALTREX) 1000 MG tablet  Has the patient contacted their pharmacy? No. (Agent: If no, request that the patient contact the pharmacy for the refill.) (Agent: If yes, when and what did the pharmacy advise?)  Preferred Pharmacy (with phone number or street name): [id number] - [place], [place] [address] [place] [address]  Phone:  [phone] Fax:  [phone]     Agent: Please be advised that RX refills may take up to 3 business days. We ask that you follow-up with your pharmacy.

## 2021-07-01 MED ORDER — VALACYCLOVIR HCL 1 G PO TABS: 1000.0000 mg | ORAL_TABLET | Freq: Two times a day (BID) | ORAL | 2 refills | Status: DC | PRN

## 2021-07-29 DIAGNOSIS — H16223 Keratoconjunctivitis sicca, not specified as Sjogren's, bilateral: Secondary | ICD-10-CM | POA: Diagnosis not present

## 2021-07-29 DIAGNOSIS — H2513 Age-related nuclear cataract, bilateral: Secondary | ICD-10-CM | POA: Diagnosis not present

## 2021-08-16 DIAGNOSIS — M62830 Muscle spasm of back: Secondary | ICD-10-CM | POA: Diagnosis not present

## 2021-08-16 DIAGNOSIS — M9904 Segmental and somatic dysfunction of sacral region: Secondary | ICD-10-CM | POA: Diagnosis not present

## 2021-08-16 DIAGNOSIS — M9901 Segmental and somatic dysfunction of cervical region: Secondary | ICD-10-CM | POA: Diagnosis not present

## 2021-08-16 DIAGNOSIS — M9902 Segmental and somatic dysfunction of thoracic region: Secondary | ICD-10-CM | POA: Diagnosis not present

## 2021-08-16 DIAGNOSIS — M530 Cervicocranial syndrome: Secondary | ICD-10-CM | POA: Diagnosis not present

## 2021-08-16 DIAGNOSIS — M25512 Pain in left shoulder: Secondary | ICD-10-CM | POA: Diagnosis not present

## 2021-08-22 DIAGNOSIS — Z23 Encounter for immunization: Secondary | ICD-10-CM | POA: Diagnosis not present

## 2021-08-30 DIAGNOSIS — H2513 Age-related nuclear cataract, bilateral: Secondary | ICD-10-CM | POA: Diagnosis not present

## 2021-08-30 DIAGNOSIS — H16223 Keratoconjunctivitis sicca, not specified as Sjogren's, bilateral: Secondary | ICD-10-CM | POA: Diagnosis not present

## 2021-10-03 DIAGNOSIS — H2513 Age-related nuclear cataract, bilateral: Secondary | ICD-10-CM | POA: Diagnosis not present

## 2021-10-03 DIAGNOSIS — H16223 Keratoconjunctivitis sicca, not specified as Sjogren's, bilateral: Secondary | ICD-10-CM | POA: Diagnosis not present

## 2021-10-05 DIAGNOSIS — M9902 Segmental and somatic dysfunction of thoracic region: Secondary | ICD-10-CM | POA: Diagnosis not present

## 2021-10-05 DIAGNOSIS — M62830 Muscle spasm of back: Secondary | ICD-10-CM | POA: Diagnosis not present

## 2021-10-05 DIAGNOSIS — M25552 Pain in left hip: Secondary | ICD-10-CM | POA: Diagnosis not present

## 2021-10-05 DIAGNOSIS — M9901 Segmental and somatic dysfunction of cervical region: Secondary | ICD-10-CM | POA: Diagnosis not present

## 2021-10-05 DIAGNOSIS — M530 Cervicocranial syndrome: Secondary | ICD-10-CM | POA: Diagnosis not present

## 2021-10-05 DIAGNOSIS — M9904 Segmental and somatic dysfunction of sacral region: Secondary | ICD-10-CM | POA: Diagnosis not present

## 2021-10-05 DIAGNOSIS — M25512 Pain in left shoulder: Secondary | ICD-10-CM | POA: Diagnosis not present

## 2021-10-22 DIAGNOSIS — U071 COVID-19: Secondary | ICD-10-CM | POA: Diagnosis not present

## 2021-11-14 DIAGNOSIS — C50912 Malignant neoplasm of unspecified site of left female breast: Secondary | ICD-10-CM | POA: Diagnosis not present

## 2021-11-16 DIAGNOSIS — M8589 Other specified disorders of bone density and structure, multiple sites: Secondary | ICD-10-CM | POA: Diagnosis not present

## 2021-11-16 DIAGNOSIS — M810 Age-related osteoporosis without current pathological fracture: Secondary | ICD-10-CM | POA: Diagnosis not present

## 2021-11-16 DIAGNOSIS — Z170 Estrogen receptor positive status [ER+]: Secondary | ICD-10-CM | POA: Diagnosis not present

## 2021-11-16 DIAGNOSIS — C50919 Malignant neoplasm of unspecified site of unspecified female breast: Secondary | ICD-10-CM | POA: Diagnosis not present

## 2021-11-17 DIAGNOSIS — Z08 Encounter for follow-up examination after completed treatment for malignant neoplasm: Secondary | ICD-10-CM | POA: Diagnosis not present

## 2021-11-17 DIAGNOSIS — Z853 Personal history of malignant neoplasm of breast: Secondary | ICD-10-CM | POA: Diagnosis not present

## 2021-11-22 DIAGNOSIS — Z08 Encounter for follow-up examination after completed treatment for malignant neoplasm: Secondary | ICD-10-CM | POA: Diagnosis not present

## 2021-11-22 DIAGNOSIS — C50912 Malignant neoplasm of unspecified site of left female breast: Secondary | ICD-10-CM | POA: Diagnosis not present

## 2021-11-22 DIAGNOSIS — Z853 Personal history of malignant neoplasm of breast: Secondary | ICD-10-CM | POA: Diagnosis not present

## 2021-11-22 DIAGNOSIS — Z170 Estrogen receptor positive status [ER+]: Secondary | ICD-10-CM | POA: Diagnosis not present

## 2021-11-30 DIAGNOSIS — M62830 Muscle spasm of back: Secondary | ICD-10-CM | POA: Diagnosis not present

## 2021-11-30 DIAGNOSIS — M9902 Segmental and somatic dysfunction of thoracic region: Secondary | ICD-10-CM | POA: Diagnosis not present

## 2021-11-30 DIAGNOSIS — M9903 Segmental and somatic dysfunction of lumbar region: Secondary | ICD-10-CM | POA: Diagnosis not present

## 2021-11-30 DIAGNOSIS — M25512 Pain in left shoulder: Secondary | ICD-10-CM | POA: Diagnosis not present

## 2021-11-30 DIAGNOSIS — M9901 Segmental and somatic dysfunction of cervical region: Secondary | ICD-10-CM | POA: Diagnosis not present

## 2021-11-30 DIAGNOSIS — R42 Dizziness and giddiness: Secondary | ICD-10-CM | POA: Diagnosis not present

## 2021-11-30 DIAGNOSIS — M530 Cervicocranial syndrome: Secondary | ICD-10-CM | POA: Diagnosis not present

## 2021-11-30 DIAGNOSIS — M25552 Pain in left hip: Secondary | ICD-10-CM | POA: Diagnosis not present

## 2021-11-30 DIAGNOSIS — M9904 Segmental and somatic dysfunction of sacral region: Secondary | ICD-10-CM | POA: Diagnosis not present

## 2021-12-07 ENCOUNTER — Ambulatory Visit (INDEPENDENT_AMBULATORY_CARE_PROVIDER_SITE_OTHER): Payer: Medicare PPO

## 2021-12-07 DIAGNOSIS — Z0000 Encounter for general adult medical examination without abnormal findings: Secondary | ICD-10-CM

## 2021-12-07 NOTE — Patient Instructions (Signed)
Judy Sampson , Thank you for taking time to come for your Medicare Wellness Visit. I appreciate your ongoing commitment to your health goals. Please review the following plan we discussed and let me know if I can assist you in the future.   Screening recommendations/referrals: Colonoscopy: done 12/24/17. Repeat 11/2022 Mammogram: done 11/17/21 Bone Density: done 11/16/21 Recommended yearly ophthalmology/optometry visit for glaucoma screening and checkup Recommended yearly dental visit for hygiene and checkup  Vaccinations: Influenza vaccine: done 08/22/21 Pneumococcal vaccine: done 07/30/17 Tdap vaccine: done 10/29/18 Shingles vaccine: done 06/06/18 & 08/13/18   Covid-19:done 10/15/19, 11/05/19, 07/29/20 & 01/29/21  Conditions/risks identified: Keep up the great work!  Next appointment: Follow up in one year for your annual wellness visit    Preventive Care 65 Years and Older, Female Preventive care refers to lifestyle choices and visits with your health care provider that can promote health and wellness. What does preventive care include? A yearly physical exam. This is also called an annual well check. Dental exams once or twice a year. Routine eye exams. Ask your health care provider how often you should have your eyes checked. Personal lifestyle choices, including: Daily care of your teeth and gums. Regular physical activity. Eating a healthy diet. Avoiding tobacco and drug use. Limiting alcohol use. Practicing safe sex. Taking low-dose aspirin every day. Taking vitamin and mineral supplements as recommended by your health care provider. What happens during an annual well check? The services and screenings done by your health care provider during your annual well check will depend on your age, overall health, lifestyle risk factors, and family history of disease. Counseling  Your health care provider may ask you questions about your: Alcohol use. Tobacco use. Drug use. Emotional  well-being. Home and relationship well-being. Sexual activity. Eating habits. History of falls. Memory and ability to understand (cognition). Work and work Statistician. Reproductive health. Screening  You may have the following tests or measurements: Height, weight, and BMI. Blood pressure. Lipid and cholesterol levels. These may be checked every 5 years, or more frequently if you are over 10 years old. Skin check. Lung cancer screening. You may have this screening every year starting at age 53 if you have a 30-pack-year history of smoking and currently smoke or have quit within the past 15 years. Fecal occult blood test (FOBT) of the stool. You may have this test every year starting at age 10. Flexible sigmoidoscopy or colonoscopy. You may have a sigmoidoscopy every 5 years or a colonoscopy every 10 years starting at age 41. Hepatitis C blood test. Hepatitis B blood test. Sexually transmitted disease (STD) testing. Diabetes screening. This is done by checking your blood sugar (glucose) after you have not eaten for a while (fasting). You may have this done every 1-3 years. Bone density scan. This is done to screen for osteoporosis. You may have this done starting at age 48. Mammogram. This may be done every 1-2 years. Talk to your health care provider about how often you should have regular mammograms. Talk with your health care provider about your test results, treatment options, and if necessary, the need for more tests. Vaccines  Your health care provider may recommend certain vaccines, such as: Influenza vaccine. This is recommended every year. Tetanus, diphtheria, and acellular pertussis (Tdap, Td) vaccine. You may need a Td booster every 10 years. Zoster vaccine. You may need this after age 80. Pneumococcal 13-valent conjugate (PCV13) vaccine. One dose is recommended after age 10. Pneumococcal polysaccharide (PPSV23) vaccine. One dose is  recommended after age 54. Talk to your  health care provider about which screenings and vaccines you need and how often you need them. This information is not intended to replace advice given to you by your health care provider. Make sure you discuss any questions you have with your health care provider. Document Released: 11/12/2015 Document Revised: 07/05/2016 Document Reviewed: 08/17/2015 Elsevier Interactive Patient Education  2017 [place] Prevention in the Home Falls can cause injuries. They can happen to people of all ages. There are many things you can do to make your home safe and to help prevent falls. What can I do on the outside of my home? Regularly fix the edges of walkways and driveways and fix any cracks. Remove anything that might make you trip as you walk through a door, such as a raised step or threshold. Trim any bushes or trees on the path to your home. Use bright outdoor lighting. Clear any walking paths of anything that might make someone trip, such as rocks or tools. Regularly check to see if handrails are loose or broken. Make sure that both sides of any steps have handrails. Any raised decks and porches should have guardrails on the edges. Have any leaves, snow, or ice cleared regularly. Use sand or salt on walking paths during winter. Clean up any spills in your garage right away. This includes oil or grease spills. What can I do in the bathroom? Use night lights. Install grab bars by the toilet and in the tub and shower. Do not use towel bars as grab bars. Use non-skid mats or decals in the tub or shower. If you need to sit down in the shower, use a plastic, non-slip stool. Keep the floor dry. Clean up any water that spills on the floor as soon as it happens. Remove soap buildup in the tub or shower regularly. Attach bath mats securely with double-sided non-slip rug tape. Do not have throw rugs and other things on the floor that can make you trip. What can I do in the bedroom? Use night  lights. Make sure that you have a light by your bed that is easy to reach. Do not use any sheets or blankets that are too big for your bed. They should not hang down onto the floor. Have a firm chair that has side arms. You can use this for support while you get dressed. Do not have throw rugs and other things on the floor that can make you trip. What can I do in the kitchen? Clean up any spills right away. Avoid walking on wet floors. Keep items that you use a lot in easy-to-reach places. If you need to reach something above you, use a strong step stool that has a grab bar. Keep electrical cords out of the way. Do not use floor polish or wax that makes floors slippery. If you must use wax, use non-skid floor wax. Do not have throw rugs and other things on the floor that can make you trip. What can I do with my stairs? Do not leave any items on the stairs. Make sure that there are handrails on both sides of the stairs and use them. Fix handrails that are broken or loose. Make sure that handrails are as long as the stairways. Check any carpeting to make sure that it is firmly attached to the stairs. Fix any carpet that is loose or worn. Avoid having throw rugs at the top or bottom of the stairs. If  you do have throw rugs, attach them to the floor with carpet tape. Make sure that you have a light switch at the top of the stairs and the bottom of the stairs. If you do not have them, ask someone to add them for you. What else can I do to help prevent falls? Wear shoes that: Do not have high heels. Have rubber bottoms. Are comfortable and fit you well. Are closed at the toe. Do not wear sandals. If you use a stepladder: Make sure that it is fully opened. Do not climb a closed stepladder. Make sure that both sides of the stepladder are locked into place. Ask someone to hold it for you, if possible. Clearly mark and make sure that you can see: Any grab bars or handrails. First and last  steps. Where the edge of each step is. Use tools that help you move around (mobility aids) if they are needed. These include: Canes. Walkers. Scooters. Crutches. Turn on the lights when you go into a dark area. Replace any light bulbs as soon as they burn out. Set up your furniture so you have a clear path. Avoid moving your furniture around. If any of your floors are uneven, fix them. If there are any pets around you, be aware of where they are. Review your medicines with your doctor. Some medicines can make you feel dizzy. This can increase your chance of falling. Ask your doctor what other things that you can do to help prevent falls. This information is not intended to replace advice given to you by your health care provider. Make sure you discuss any questions you have with your health care provider. Document Released: 08/12/2009 Document Revised: 03/23/2016 Document Reviewed: 11/20/2014 Elsevier Interactive Patient Education  2017 [employer].

## 2021-12-07 NOTE — Progress Notes (Signed)
Subjective:   Judy Sampson is a 76 y.o. female who presents for Medicare Annual (Subsequent) preventive examination.  Virtual Visit via Telephone Note  I connected with  Judy Sampson on 12/07/21 at  8:40 AM EST by telephone and verified that I am speaking with the correct person using two identifiers.  Location: Patient: home Provider: [facility] Persons participating in the virtual visit: [place]   I discussed the limitations, risks, security and privacy concerns of performing an evaluation and management service by telephone and the availability of in person appointments. The patient expressed understanding and agreed to proceed.  Interactive audio and video telecommunications were attempted between this nurse and patient, however failed, due to patient having technical difficulties OR patient did not have access to video capability.  We continued and completed visit with audio only.  Some vital signs may be absent or patient reported.   Clemetine Marker, LPN   Review of Systems     Cardiac Risk Factors include: advanced age (>77men, >77 women)     Objective:    There were no vitals filed for this visit. There is no height or weight on file to calculate BMI.  Advanced Directives 12/07/2021 12/06/2020 12/03/2019 11/27/2018 11/21/2017 10/04/2016 11/16/2015  Does Patient Have a Medical Advance Directive? Yes Yes Yes Yes No No No  Type of Paramedic of [place];Living will [place];Living will [place];Living will Living will;Healthcare Power of Attorney - - -  Copy of [place] in Chart? Yes - validated most recent copy scanned in chart (See row information) Yes - validated most recent copy scanned in chart (See row information) Yes - validated most recent copy scanned in chart (See row information) Yes - validated most recent copy scanned in chart (See row information) - - -  Would patient like  information on creating a medical advance directive? - - - - Yes (MAU/Ambulatory/Procedural Areas - Information given) - No - patient declined information    Current Medications (verified) Outpatient Encounter Medications as of 12/07/2021  Medication Sig   Calcium-Magnesium-Vitamin D (CALCIUM MAGNESIUM PO) Take 4 tablets by mouth daily.    Flaxseed, Linseed, (FLAX SEED OIL) 1000 MG CAPS Take by mouth.   Omega-3 Fatty Acids (FISH OIL BURP-LESS) 1000 MG CAPS Take by mouth.   ondansetron (ZOFRAN ODT) 4 MG disintegrating tablet Take 1 tablet (4 mg total) by mouth every 8 (eight) hours as needed for nausea or vomiting.   sertraline (ZOLOFT) 50 MG tablet Take 1 tablet (50 mg total) by mouth daily.   valACYclovir (VALTREX) 1000 MG tablet Take 1 tablet (1,000 mg total) by mouth 2 (two) times daily as needed.   No facility-administered encounter medications on file as of 12/07/2021.    Allergies (verified) Scopolamine and Bisphosphonates   History: Past Medical History:  Diagnosis Date   Anxiety    Cancer ([place])    breast   Claustrophobia    Depression    GERD (gastroesophageal reflux disease)    H/O cold sores    Past Surgical History:  Procedure Laterality Date   FOOT SURGERY Bilateral    MASTECTOMY MODIFIED RADICAL Right 1998   XRT/Chemo + 5 yrs  Aromasin   OOPHORECTOMY Left    Family History  Problem Relation Age of Onset   Diabetes Mother    Parkinson's disease Mother    Breast cancer Mother        60   Heart disease Father  Parkinson's disease Brother    Social History   Socioeconomic History   Marital status: Divorced    Spouse name: Not on file   Number of children: 0   Years of education: Not on file   Highest education level: Master's degree (e.g., MA, MS, MEng, MEd, MSW, MBA)  Occupational History   Occupation: Retired  Tobacco Use   Smoking status: Never   Smokeless tobacco: Never   Tobacco comments:    smoking cessation materials not required  Vaping Use    Vaping Use: Never used  Substance and Sexual Activity   Alcohol use: Yes    Alcohol/week: 2.0 standard drinks    Types: 2 Standard drinks or equivalent per week    Comment: occasional   Drug use: No   Sexual activity: Not Currently  Other Topics Concern   Not on file  Social History Narrative   Not on file   Social Determinants of Health   Financial Resource Strain: Low Risk    Difficulty of Paying Living Expenses: Not hard at all  Food Insecurity: No Food Insecurity   Worried About Charity fundraiser in the Last Year: Never true   [place] in the Last Year: Never true  Transportation Needs: No Transportation Needs   Lack of Transportation (Medical): No   Lack of Transportation (Non-Medical): No  Physical Activity: Sufficiently Active   Days of Exercise per Week: 7 days   Minutes of Exercise per Session: 40 min  Stress: No Stress Concern Present   Feeling of Stress : Only a little  Social Connections: Moderately Integrated   Frequency of Communication with Friends and Family: More than three times a week   Frequency of Social Gatherings with Friends and Family: Three times a week   Attends Religious Services: More than 4 times per year   Active Member of Clubs or Organizations: Yes   Attends Music therapist: More than 4 times per year   Marital Status: Divorced    Tobacco Counseling Counseling given: Not Answered Tobacco comments: smoking cessation materials not required   Clinical Intake:  Pre-visit preparation completed: Yes  Pain : No/denies pain     Nutritional Risks: None Diabetes: No  How often do you need to have someone help you when you read instructions, pamphlets, or other written materials from your doctor or pharmacy?: 1 - Never   Interpreter Needed?: No  Information entered by :: Clemetine Marker LPN   Activities of Daily Living In your present state of health, do you have any difficulty performing the following  activities: 12/07/2021 03/25/2021  Hearing? N N  Vision? N N  Difficulty concentrating or making decisions? N N  Walking or climbing stairs? N N  Dressing or bathing? N N  Doing errands, shopping? N N  Preparing Food and eating ? N -  Using the Toilet? N -  In the past six months, have you accidently leaked urine? Y -  Comment wears pads for protection -  Do you have problems with loss of bowel control? N -  Managing your Medications? N -  Managing your Finances? N -  Housekeeping or managing your Housekeeping? N -  Some recent data might be hidden    Patient Care Team: Glean Hess, MD as PCP - General (Internal Medicine) Dr. Enos Fling (Obstetrics and Gynecology) Laveda Norman, MD as Consulting Physician (Hematology and Oncology) [place] (Dermatology)  Indicate any recent Medical Services you may have  received from other than [facility] providers in the past year (date may be approximate).     Assessment:   This is a routine wellness examination for Judy Sampson.  Hearing/Vision screen Hearing Screening - Comments:: Pt denies hearing difficulty Vision Screening - Comments:: Annual vision screen[facility]  Dietary issues and exercise activities discussed: Current Exercise Habits: Home exercise routine, Type of exercise: walking;yoga;stretching, Time (Minutes): 40, Frequency (Times/Week): 7, Weekly Exercise (Minutes/Week): 280, Intensity: Moderate, Exercise limited by: None identified   Goals Addressed             This Visit's Progress    DIET - INCREASE WATER INTAKE   On track    Recommend to drink at least 6-8 8oz glasses of water per day.      Patient Stated   On track    Continue healthy diet and exercise for cholesterol and weight management.        Depression Screen PHQ 2/9 Scores 12/07/2021 03/25/2021 12/22/2020 12/06/2020 12/03/2019 11/20/2019 11/27/2018  PHQ - 2 Score 0 0 0 0 0 0 0  PHQ- 9 Score - 0 0 - - 0 -     Fall Risk Fall Risk  12/07/2021 03/25/2021 12/22/2020 12/06/2020 12/03/2019  Falls in the past year? 0 0 0 0 0  Number falls in past yr: 0 - 0 0 0  Injury with Fall? 0 - 0 0 0  Risk for fall due to : No Fall Risks - - No Fall Risks No Fall Risks  Follow up Falls prevention discussed Falls evaluation completed Falls evaluation completed Falls prevention discussed Falls prevention discussed    FALL RISK PREVENTION PERTAINING TO THE HOME:  Any stairs in or around the home? Yes  If so, are there any without handrails? No  Home free of loose throw rugs in walkways, pet beds, electrical cords, etc? Yes  Adequate lighting in your home to reduce risk of falls? Yes   ASSISTIVE DEVICES UTILIZED TO PREVENT FALLS:  Life alert? No  Use of a cane, walker or w/c? No  Grab bars in the bathroom? Yes  Shower chair or bench in shower? No  Elevated toilet seat or a handicapped toilet? No   TIMED UP AND GO:  Was the test performed? No . Telephonic visit   Cognitive Function: Normal cognitive status assessed by direct observation by this Nurse Health Advisor. No abnormalities found.       6CIT Screen 11/27/2018 11/21/2017 10/04/2016  What Year? 0 points 0 points 0 points  What month? 0 points 0 points 0 points  What time? 0 points 0 points 0 points  Count back from 20 0 points 0 points 0 points  Months in reverse 0 points 0 points 0 points  Repeat phrase 0 points 0 points 0 points  Total Score 0 0 0    Immunizations Immunization History  Administered Date(s) Administered   Influenza, High Dose Seasonal PF 07/29/2020   Influenza-Unspecified 07/30/2014, 08/21/2018, 07/20/2019, 08/22/2021   PFIZER(Purple Top)SARS-COV-2 Vaccination 10/15/2019, 11/05/2019, 07/29/2020, 01/29/2021   Pneumococcal Conjugate-13 11/19/2014   Pneumococcal Polysaccharide-23 11/16/2015   Pneumococcal-Unspecified 07/30/2017   Tdap 08/31/2015, 10/29/2018   Zoster Recombinat (Shingrix) 06/06/2018, 08/13/2018   Zoster, Live  11/01/2007, 08/21/2017, 01/10/2019    TDAP status: Up to date  Flu Vaccine status: Up to date  Pneumococcal vaccine status: Up to date  Covid-19 vaccine status: Completed vaccines  Qualifies for Shingles Vaccine? Yes   Zostavax completed Yes   Shingrix Completed?:  Yes  Screening Tests Health Maintenance  Topic Date Due   COVID-19 Vaccine (5 - Booster for Pfizer series) 03/26/2021   MAMMOGRAM  11/17/2022   COLONOSCOPY (Pts 45-44yrs Insurance coverage will need to be confirmed)  12/24/2022   TETANUS/TDAP  10/29/2028   Pneumonia Vaccine 24+ Years old  Completed   INFLUENZA VACCINE  Completed   DEXA SCAN  Completed   Hepatitis C Screening  Completed   Zoster Vaccines- Shingrix  Completed   HPV VACCINES  Aged Out    Health Maintenance  Health Maintenance Due  Topic Date Due   COVID-19 Vaccine (5 - Booster for [place] series) 03/26/2021    Colorectal cancer screening: Type of screening: Colonoscopy. Completed 12/24/17. Repeat every 5 years  Mammogram status: Completed 11/17/21. Repeat every year  Bone Density status: Completed 11/16/21. Results reflect: Bone density results: OSTEOPOROSIS. Repeat every 2 years.  Lung Cancer Screening: (Low Dose CT Chest recommended if Age 23-80 years, 30 pack-year currently smoking OR have quit w/in 15years.) does not qualify.    Additional Screening:  Hepatitis C Screening: does qualify; Completed 10/04/16  Vision Screening: Recommended annual ophthalmology exams for early detection of glaucoma and other disorders of the eye. Is the patient up to date with their annual eye exam?  Yes  Who is the provider or what is the name of the office in which the patient attends annual eye exams? Dr. Marina Gravel.   Dental Screening: Recommended annual dental exams for proper oral hygiene  Community Resource Referral / Chronic Care Management: CRR required this visit?  No   CCM required this visit?  No      Plan:     I have personally reviewed and  noted the following in the patients chart:   Medical and social history Use of alcohol, tobacco or illicit drugs  Current medications and supplements including opioid prescriptions.  Functional ability and status Nutritional status Physical activity Advanced directives List of other physicians Hospitalizations, surgeries, and ER visits in previous 12 months Vitals Screenings to include cognitive, depression, and falls Referrals and appointments  In addition, I have reviewed and discussed with patient certain preventive protocols, quality metrics, and best practice recommendations. A written personalized care plan for preventive services as well as general preventive health recommendations were provided to patient.   Due to this being a telephonic visit, the after visit summary with patients personalized plan was offered to patient via my-chart.   Clemetine Marker, LPN   [birth year]   Nurse Notes: noner

## 2021-12-28 ENCOUNTER — Other Ambulatory Visit: Payer: Self-pay

## 2021-12-28 ENCOUNTER — Ambulatory Visit (INDEPENDENT_AMBULATORY_CARE_PROVIDER_SITE_OTHER): Payer: Medicare PPO | Admitting: Internal Medicine

## 2021-12-28 ENCOUNTER — Encounter: Payer: Self-pay | Admitting: Internal Medicine

## 2021-12-28 VITALS — BP 118/70 | HR 69 | Ht 68.0 in | Wt 128.0 lb

## 2021-12-28 DIAGNOSIS — E785 Hyperlipidemia, unspecified: Secondary | ICD-10-CM

## 2021-12-28 DIAGNOSIS — M810 Age-related osteoporosis without current pathological fracture: Secondary | ICD-10-CM

## 2021-12-28 DIAGNOSIS — Z853 Personal history of malignant neoplasm of breast: Secondary | ICD-10-CM

## 2021-12-28 DIAGNOSIS — E559 Vitamin D deficiency, unspecified: Secondary | ICD-10-CM

## 2021-12-28 DIAGNOSIS — Z0000 Encounter for general adult medical examination without abnormal findings: Secondary | ICD-10-CM | POA: Diagnosis not present

## 2021-12-28 DIAGNOSIS — F411 Generalized anxiety disorder: Secondary | ICD-10-CM | POA: Diagnosis not present

## 2021-12-28 NOTE — Progress Notes (Signed)
Date:  12/28/2021   Name:  Judy Sampson   DOB:  08-Feb-1946   MRN:  [id number]   Chief Complaint: Annual Exam (Breast exam no pap ) Judy Sampson is a 76 y.o. female who presents today for her Complete Annual Exam. She feels well. She reports exercising. She reports she is sleeping well. Breast complaints - none.  Mammogram: 10/2021 DEXA: 10/2021 osteopenia/osteoporosis Pap smear: discontinued Colonoscopy: 11/2017  Immunization History  Administered Date(s) Administered   Influenza, High Dose Seasonal PF 07/29/2020   Influenza-Unspecified 07/30/2014, 08/21/2018, 07/20/2019, 08/22/2021   PFIZER(Purple Top)SARS-COV-2 Vaccination 10/15/2019, 11/05/2019, 07/29/2020, 01/29/2021   Pneumococcal Conjugate-13 11/19/2014   Pneumococcal Polysaccharide-23 11/16/2015   Pneumococcal-Unspecified 07/30/2017   Tdap 08/31/2015, 10/29/2018   Zoster Recombinat (Shingrix) 06/06/2018, 08/13/2018   Zoster, Live 11/01/2007, 08/21/2017, 01/10/2019    Anxiety Presents for follow-up visit. Patient reports no chest pain, dizziness, nervous/anxious behavior, palpitations or shortness of breath. Symptoms occur occasionally.   Compliance with medications is 76-100% (sertraline).   Osteoporosis - recent DEXA showed worsening bone density. IMPRESSION  10/2021 1.WHO classification is OSTEOPOROSIS.  2.There is statistically significant change since a comparison study, detailed above.  Lab Results  Component Value Date   NA 132 (A) 11/16/2020   K 4.4 11/16/2020   CO2 22 11/16/2020   GLUCOSE 91 11/20/2019   BUN 17 11/16/2020   CREATININE 1.0 11/16/2020   CALCIUM 9.3 11/16/2020   GFRNONAA 60 11/16/2020   Lab Results  Component Value Date   CHOL 247 (H) 12/22/2020   HDL 87 12/22/2020   LDLCALC 152 (H) 12/22/2020   TRIG 50 12/22/2020   CHOLHDL 2.8 12/22/2020   Lab Results  Component Value Date   TSH 1.150 12/22/2020   No results found for: HGBA1C Lab Results  Component Value Date   WBC 5.1  11/16/2020   HGB 12.8 11/16/2020   HCT 38 11/16/2020   MCV 89 11/20/2019   PLT 169 11/16/2020   Lab Results  Component Value Date   ALT 17 11/16/2020   AST 27 11/16/2020   ALKPHOS 70 11/16/2020   BILITOT 0.3 11/20/2019   Lab Results  Component Value Date   VD25OH 38.6 11/16/2020     Review of Systems  Constitutional:  Negative for chills, fatigue and fever.  HENT:  Negative for congestion, hearing loss, tinnitus, trouble swallowing and voice change.   Eyes:  Negative for visual disturbance.  Respiratory:  Negative for cough, chest tightness, shortness of breath and wheezing.   Cardiovascular:  Negative for chest pain, palpitations and leg swelling.  Gastrointestinal:  Negative for abdominal pain, constipation, diarrhea and vomiting.  Endocrine: Negative for polydipsia and polyuria.  Genitourinary:  Negative for dysuria, frequency, genital sores, vaginal bleeding and vaginal discharge.  Musculoskeletal:  Negative for arthralgias, gait problem and joint swelling.  Skin:  Negative for color change and rash.  Neurological:  Negative for dizziness, tremors, light-headedness and headaches.  Hematological:  Negative for adenopathy. Does not bruise/bleed easily.  Psychiatric/Behavioral:  Negative for dysphoric mood and sleep disturbance. The patient is not nervous/anxious.    Patient Active Problem List   Diagnosis Date Noted   Mild hyperlipidemia 12/22/2020   Generalized anxiety disorder 11/20/2019   Vitamin D deficiency 11/20/2019   Adenomatous polyp of colon 07/03/2017   Muscle spasms of neck 10/04/2016   Cardiac arrhythmia 10/04/2016   Hx of herpes genitalis 05/26/2015   Osteoporosis 05/26/2015   Claustrophobia 05/26/2015   Acid reflux 05/26/2015   Personal history of malignant neoplasm  of breast 10/30/1996    Allergies  Allergen Reactions   Scopolamine Nausea And Vomiting    headache   Bisphosphonates     Past Surgical History:  Procedure Laterality Date   FOOT  SURGERY Bilateral    MASTECTOMY MODIFIED RADICAL Right 1998   XRT/Chemo + 5 yrs  Aromasin   OOPHORECTOMY Left     Social History   Tobacco Use   Smoking status: Never   Smokeless tobacco: Never   Tobacco comments:    smoking cessation materials not required  Vaping Use   Vaping Use: Never used  Substance Use Topics   Alcohol use: Yes    Alcohol/week: 2.0 standard drinks    Types: 2 Standard drinks or equivalent per week    Comment: occasional   Drug use: No     Medication list has been reviewed and updated.  Current Meds  Medication Sig   Calcium-Magnesium-Vitamin D (CALCIUM MAGNESIUM PO) Take 4 tablets by mouth daily.    Flaxseed, Linseed, (FLAX SEED OIL) 1000 MG CAPS Take by mouth.   Omega-3 Fatty Acids (FISH OIL BURP-LESS) 1000 MG CAPS Take by mouth.   ondansetron (ZOFRAN ODT) 4 MG disintegrating tablet Take 1 tablet (4 mg total) by mouth every 8 (eight) hours as needed for nausea or vomiting.   sertraline (ZOLOFT) 50 MG tablet Take 1 tablet (50 mg total) by mouth daily.   valACYclovir (VALTREX) 1000 MG tablet Take 1 tablet (1,000 mg total) by mouth 2 (two) times daily as needed.    PHQ 2/9 Scores 12/28/2021 12/07/2021 03/25/2021 12/22/2020  PHQ - 2 Score 0 0 0 0  PHQ- 9 Score 0 - 0 0    GAD 7 : Generalized Anxiety Score 12/28/2021 03/25/2021 12/22/2020 05/15/2016  Nervous, Anxious, on Edge 0 1 0 3  Control/stop worrying 0 0 0 3  Worry too much - different things 0 0 0 3  Trouble relaxing 0 0 0 0  Restless 0 0 0 0  Easily annoyed or irritable 0 0 0 0  Afraid - awful might happen 0 0 0 0  Total GAD 7 Score 0 1 0 9  Anxiety Difficulty Not difficult at all - Not difficult at all -    BP Readings from Last 3 Encounters:  12/28/21 118/70  03/25/21 124/86  12/22/20 130/82    Physical Exam Vitals and nursing note reviewed.  Constitutional:      General: She is not in acute distress.    Appearance: She is well-developed.  HENT:     Head: Normocephalic and atraumatic.      Right Ear: Tympanic membrane and ear canal normal.     Left Ear: Tympanic membrane and ear canal normal.     Nose:     Right Sinus: No maxillary sinus tenderness.     Left Sinus: No maxillary sinus tenderness.  Eyes:     General: No scleral icterus.       Right eye: No discharge.        Left eye: No discharge.     Conjunctiva/sclera: Conjunctivae normal.  Neck:     Thyroid: No thyromegaly.     Vascular: No carotid bruit.  Cardiovascular:     Rate and Rhythm: Normal rate and regular rhythm.     Pulses: Normal pulses.     Heart sounds: Normal heart sounds.  Pulmonary:     Effort: Pulmonary effort is normal. No respiratory distress.     Breath sounds: No wheezing.  Abdominal:  General: Bowel sounds are normal.     Palpations: Abdomen is soft.     Tenderness: There is no abdominal tenderness.  Musculoskeletal:     Cervical back: Normal range of motion. No erythema.     Right lower leg: No edema.     Left lower leg: No edema.  Lymphadenopathy:     Cervical: No cervical adenopathy.  Skin:    General: Skin is warm and dry.     Findings: No rash.  Neurological:     Mental Status: She is alert and oriented to person, place, and time.     Cranial Nerves: No cranial nerve deficit.     Sensory: No sensory deficit.     Deep Tendon Reflexes: Reflexes are normal and symmetric.  Psychiatric:        Attention and Perception: Attention normal.        Mood and Affect: Mood normal.    Wt Readings from Last 3 Encounters:  12/28/21 128 lb (58.1 kg)  03/25/21 129 lb (58.5 kg)  12/22/20 139 lb (63 kg)    BP 118/70    Pulse 69    Ht 5\' 8"  (1.727 m)    Wt 128 lb (58.1 kg)    SpO2 99%    BMI 19.46 kg/m   Assessment and Plan: 1. Annual physical exam Normal exam. Continue regular exercise and healthy diet Up to date on screenings and immunizations.  2. Age-related osteoporosis without current pathological fracture Recent DEXA showed OP at the wrist and osteopenia of the  hip. She wants to continue calcium and vitamin D and repeat DEXA next year after discussion with Oncology. - CBC with Differential/Platelet  3. Generalized anxiety disorder Clinically stable on current regimen with good control of symptoms, No SI or HI. Will continue current therapy with low dose sertraline 25 mg. - TSH  4. Vitamin D deficiency Check levels; continue supplementation. - VITAMIN D 25 Hydroxy (Vit-D Deficiency, Fractures)  5. Mild hyperlipidemia Check labs and advise. - Comprehensive metabolic panel - Lipid panel  6. Personal history of malignant neoplasm of breast Followed by Oncology   Partially dictated using Dragon software. Any errors are unintentional.  Halina Maidens, MD [facility]  12/28/2021

## 2021-12-29 LAB — COMPREHENSIVE METABOLIC PANEL
ALT: 16 IU/L (ref 0–32)
AST: 22 IU/L (ref 0–40)
Albumin/Globulin Ratio: 2 (ref 1.2–2.2)
Albumin: 4.3 g/dL (ref 3.7–4.7)
Alkaline Phosphatase: 72 IU/L (ref 44–121)
BUN/Creatinine Ratio: 18 (ref 12–28)
BUN: 16 mg/dL (ref 8–27)
Bilirubin Total: 0.5 mg/dL (ref 0.0–1.2)
CO2: 24 mmol/L (ref 20–29)
Calcium: 9.3 mg/dL (ref 8.7–10.3)
Chloride: 102 mmol/L (ref 96–106)
Creatinine, Ser: 0.88 mg/dL (ref 0.57–1.00)
Globulin, Total: 2.2 g/dL (ref 1.5–4.5)
Glucose: 84 mg/dL (ref 70–99)
Potassium: 5.1 mmol/L (ref 3.5–5.2)
Sodium: 140 mmol/L (ref 134–144)
Total Protein: 6.5 g/dL (ref 6.0–8.5)
eGFR: 69 mL/min/{1.73_m2} (ref >59)

## 2021-12-29 LAB — CBC WITH DIFFERENTIAL/PLATELET
Basophils Absolute: 0 10*3/uL (ref 0.0–0.2)
Basos: 1 %
EOS (ABSOLUTE): 0.1 10*3/uL (ref 0.0–0.4)
Eos: 1 %
Hematocrit: 38.8 % (ref 34.0–46.6)
Hemoglobin: 13.2 g/dL (ref 11.1–15.9)
Immature Grans (Abs): 0 10*3/uL (ref 0.0–0.1)
Immature Granulocytes: 0 %
Lymphocytes Absolute: 1.6 10*3/uL (ref 0.7–3.1)
Lymphs: 28 %
MCH: 30 pg (ref 26.6–33.0)
MCHC: 34 g/dL (ref 31.5–35.7)
MCV: 88 fL (ref 79–97)
Monocytes Absolute: 0.4 10*3/uL (ref 0.1–0.9)
Monocytes: 8 %
Neutrophils Absolute: 3.7 10*3/uL (ref 1.4–7.0)
Neutrophils: 62 %
Platelets: 181 10*3/uL (ref 150–450)
RBC: 4.4 x10E6/uL (ref 3.77–5.28)
RDW: 12.2 % (ref 11.7–15.4)
WBC: 5.9 10*3/uL (ref 3.4–10.8)

## 2021-12-29 LAB — LIPID PANEL
Chol/HDL Ratio: 3 ratio (ref 0.0–4.4)
Cholesterol, Total: 221 mg/dL — ABNORMAL HIGH (ref 100–199)
HDL: 74 mg/dL (ref >39)
LDL Chol Calc (NIH): 138 mg/dL — ABNORMAL HIGH (ref 0–99)
Triglycerides: 52 mg/dL (ref 0–149)
VLDL Cholesterol Cal: 9 mg/dL (ref 5–40)

## 2021-12-29 LAB — TSH: TSH: 0.997 u[IU]/mL (ref 0.450–4.500)

## 2021-12-29 LAB — VITAMIN D 25 HYDROXY (VIT D DEFICIENCY, FRACTURES): Vit D, 25-Hydroxy: 34.5 ng/mL (ref 30.0–100.0)

## 2022-01-31 ENCOUNTER — Other Ambulatory Visit: Payer: Self-pay | Admitting: Internal Medicine

## 2022-01-31 DIAGNOSIS — F411 Generalized anxiety disorder: Secondary | ICD-10-CM

## 2022-02-01 NOTE — Telephone Encounter (Signed)
Requested medication (s) are due for refill today: yes ? ?Requested medication (s) are on the active medication list: yes ? ?Last refill:  06/30/21 ? ?Future visit scheduled: yes ? ?Notes to clinic:  Unable to refill per protocol, cannot delegate.  ? ? ?  ?Requested Prescriptions  ?Pending Prescriptions Disp Refills  ? sertraline (ZOLOFT) 50 MG tablet [Pharmacy Med Name: SERTRALINE '50MG'$  TABLETS] 90 tablet 3  ?  Sig: TAKE 1 TABLET(50 MG) BY MOUTH DAILY  ?  ? Not Delegated - Psychiatry:  Antidepressants - SSRI - sertraline Failed - 01/31/2022 10:21 AM  ?  ?  Failed - This refill cannot be delegated  ?  ?  Passed - AST in normal range and within 360 days  ?  AST  ?Date Value Ref Range Status  ?12/28/2021 22 0 - 40 IU/L Final  ?  ?  ?  ?  Passed - ALT in normal range and within 360 days  ?  ALT  ?Date Value Ref Range Status  ?12/28/2021 16 0 - 32 IU/L Final  ?  ?  ?  ?  Passed - Completed PHQ-2 or PHQ-9 in the last 360 days  ?  ?  Passed - Valid encounter within last 6 months  ?  Recent Outpatient Visits   ? ?      ? 1 month ago Annual physical exam  ? [facility] Glean Hess, MD  ? 10 months ago Drug-induced headache, not elsewhere classified, not intractable  ? [facility] Glean Hess, MD  ? 1 year ago Annual physical exam  ? [facility] Glean Hess, MD  ? 2 years ago Annual physical exam  ? [facility]an [facility], [facility]  ? 3 years ago Annual physical exam  ? [facility] Glean Hess, MD  ? ?  ?  ?Future Appointments   ? ?        ? In 11 months Glean Hess, MD [facility] [place]  ? ?  ? ?  ?  ?  ? ? ?

## 2022-02-15 DIAGNOSIS — D485 Neoplasm of uncertain behavior of skin: Secondary | ICD-10-CM | POA: Diagnosis not present

## 2022-02-15 DIAGNOSIS — L218 Other seborrheic dermatitis: Secondary | ICD-10-CM | POA: Diagnosis not present

## 2022-02-15 DIAGNOSIS — D2262 Melanocytic nevi of left upper limb, including shoulder: Secondary | ICD-10-CM | POA: Diagnosis not present

## 2022-02-15 DIAGNOSIS — D2271 Melanocytic nevi of right lower limb, including hip: Secondary | ICD-10-CM | POA: Diagnosis not present

## 2022-02-15 DIAGNOSIS — Z7189 Other specified counseling: Secondary | ICD-10-CM | POA: Diagnosis not present

## 2022-02-15 DIAGNOSIS — L570 Actinic keratosis: Secondary | ICD-10-CM | POA: Diagnosis not present

## 2022-02-15 DIAGNOSIS — L821 Other seborrheic keratosis: Secondary | ICD-10-CM | POA: Diagnosis not present

## 2022-02-15 DIAGNOSIS — X32XXXA Exposure to sunlight, initial encounter: Secondary | ICD-10-CM | POA: Diagnosis not present

## 2022-02-15 DIAGNOSIS — D2272 Melanocytic nevi of left lower limb, including hip: Secondary | ICD-10-CM | POA: Diagnosis not present

## 2022-04-01 DIAGNOSIS — W57XXXA Bitten or stung by nonvenomous insect and other nonvenomous arthropods, initial encounter: Secondary | ICD-10-CM | POA: Diagnosis not present

## 2022-04-01 DIAGNOSIS — S80862A Insect bite (nonvenomous), left lower leg, initial encounter: Secondary | ICD-10-CM | POA: Diagnosis not present

## 2022-04-14 DIAGNOSIS — M19072 Primary osteoarthritis, left ankle and foot: Secondary | ICD-10-CM | POA: Diagnosis not present

## 2022-04-14 DIAGNOSIS — M79672 Pain in left foot: Secondary | ICD-10-CM | POA: Diagnosis not present

## 2022-04-18 DIAGNOSIS — M60861 Other myositis, right lower leg: Secondary | ICD-10-CM | POA: Diagnosis not present

## 2022-04-18 DIAGNOSIS — M530 Cervicocranial syndrome: Secondary | ICD-10-CM | POA: Diagnosis not present

## 2022-04-18 DIAGNOSIS — M7741 Metatarsalgia, right foot: Secondary | ICD-10-CM | POA: Diagnosis not present

## 2022-04-18 DIAGNOSIS — M9902 Segmental and somatic dysfunction of thoracic region: Secondary | ICD-10-CM | POA: Diagnosis not present

## 2022-04-18 DIAGNOSIS — M9901 Segmental and somatic dysfunction of cervical region: Secondary | ICD-10-CM | POA: Diagnosis not present

## 2022-04-18 DIAGNOSIS — M62830 Muscle spasm of back: Secondary | ICD-10-CM | POA: Diagnosis not present

## 2022-04-18 DIAGNOSIS — M9904 Segmental and somatic dysfunction of sacral region: Secondary | ICD-10-CM | POA: Diagnosis not present

## 2022-04-21 DIAGNOSIS — M9903 Segmental and somatic dysfunction of lumbar region: Secondary | ICD-10-CM | POA: Diagnosis not present

## 2022-04-21 DIAGNOSIS — M62830 Muscle spasm of back: Secondary | ICD-10-CM | POA: Diagnosis not present

## 2022-04-21 DIAGNOSIS — M9901 Segmental and somatic dysfunction of cervical region: Secondary | ICD-10-CM | POA: Diagnosis not present

## 2022-04-21 DIAGNOSIS — R293 Abnormal posture: Secondary | ICD-10-CM | POA: Diagnosis not present

## 2022-04-21 DIAGNOSIS — M25561 Pain in right knee: Secondary | ICD-10-CM | POA: Diagnosis not present

## 2022-04-21 DIAGNOSIS — M60861 Other myositis, right lower leg: Secondary | ICD-10-CM | POA: Diagnosis not present

## 2022-04-21 DIAGNOSIS — M9904 Segmental and somatic dysfunction of sacral region: Secondary | ICD-10-CM | POA: Diagnosis not present

## 2022-04-27 DIAGNOSIS — M62830 Muscle spasm of back: Secondary | ICD-10-CM | POA: Diagnosis not present

## 2022-04-27 DIAGNOSIS — M9901 Segmental and somatic dysfunction of cervical region: Secondary | ICD-10-CM | POA: Diagnosis not present

## 2022-04-27 DIAGNOSIS — M25552 Pain in left hip: Secondary | ICD-10-CM | POA: Diagnosis not present

## 2022-04-27 DIAGNOSIS — R293 Abnormal posture: Secondary | ICD-10-CM | POA: Diagnosis not present

## 2022-04-27 DIAGNOSIS — M9903 Segmental and somatic dysfunction of lumbar region: Secondary | ICD-10-CM | POA: Diagnosis not present

## 2022-04-27 DIAGNOSIS — M60861 Other myositis, right lower leg: Secondary | ICD-10-CM | POA: Diagnosis not present

## 2022-04-27 DIAGNOSIS — M9904 Segmental and somatic dysfunction of sacral region: Secondary | ICD-10-CM | POA: Diagnosis not present

## 2022-05-07 DIAGNOSIS — Z6820 Body mass index (BMI) 20.0-20.9, adult: Secondary | ICD-10-CM | POA: Diagnosis not present

## 2022-05-07 DIAGNOSIS — L03012 Cellulitis of left finger: Secondary | ICD-10-CM | POA: Diagnosis not present

## 2022-05-08 ENCOUNTER — Other Ambulatory Visit: Payer: Self-pay | Admitting: Internal Medicine

## 2022-05-08 DIAGNOSIS — F411 Generalized anxiety disorder: Secondary | ICD-10-CM

## 2022-05-09 NOTE — Telephone Encounter (Signed)
Requested Prescriptions  Pending Prescriptions Disp Refills  . sertraline (ZOLOFT) 50 MG tablet [Pharmacy Med Name: SERTRALINE '50MG'$  TABLETS] 90 tablet 0    Sig: TAKE 1 TABLET(50 MG) BY MOUTH DAILY     Psychiatry:  Antidepressants - SSRI - sertraline Passed - 05/08/2022  8:43 AM      Passed - AST in normal range and within 360 days    AST  Date Value Ref Range Status  12/28/2021 22 0 - 40 IU/L Final         Passed - ALT in normal range and within 360 days    ALT  Date Value Ref Range Status  12/28/2021 16 0 - 32 IU/L Final         Passed - Completed PHQ-2 or PHQ-9 in the last 360 days      Passed - Valid encounter within last 6 months    Recent Outpatient Visits          4 months ago Annual physical exam   [facility] Glean Hess, MD   1 year ago Drug-induced headache, not elsewhere classified, not intractable   [facility] Glean Hess, MD   1 year ago Annual physical exam   [facility] Glean Hess, MD   2 years ago Annual physical exam   [facility] Glean Hess, MD   3 years ago Annual physical exam   [facility] Glean Hess, MD      Future Appointments            In 7 months Army Melia Jesse Sans, MD [facility]

## 2022-05-29 DIAGNOSIS — M62830 Muscle spasm of back: Secondary | ICD-10-CM | POA: Diagnosis not present

## 2022-05-29 DIAGNOSIS — M60862 Other myositis, left lower leg: Secondary | ICD-10-CM | POA: Diagnosis not present

## 2022-05-29 DIAGNOSIS — M9904 Segmental and somatic dysfunction of sacral region: Secondary | ICD-10-CM | POA: Diagnosis not present

## 2022-05-29 DIAGNOSIS — R293 Abnormal posture: Secondary | ICD-10-CM | POA: Diagnosis not present

## 2022-05-29 DIAGNOSIS — M9902 Segmental and somatic dysfunction of thoracic region: Secondary | ICD-10-CM | POA: Diagnosis not present

## 2022-05-29 DIAGNOSIS — M9901 Segmental and somatic dysfunction of cervical region: Secondary | ICD-10-CM | POA: Diagnosis not present

## 2022-06-14 DIAGNOSIS — M62830 Muscle spasm of back: Secondary | ICD-10-CM | POA: Diagnosis not present

## 2022-06-14 DIAGNOSIS — M9904 Segmental and somatic dysfunction of sacral region: Secondary | ICD-10-CM | POA: Diagnosis not present

## 2022-06-14 DIAGNOSIS — R293 Abnormal posture: Secondary | ICD-10-CM | POA: Diagnosis not present

## 2022-06-14 DIAGNOSIS — M25551 Pain in right hip: Secondary | ICD-10-CM | POA: Diagnosis not present

## 2022-06-14 DIAGNOSIS — M9902 Segmental and somatic dysfunction of thoracic region: Secondary | ICD-10-CM | POA: Diagnosis not present

## 2022-06-14 DIAGNOSIS — M9901 Segmental and somatic dysfunction of cervical region: Secondary | ICD-10-CM | POA: Diagnosis not present

## 2022-07-27 DIAGNOSIS — H2513 Age-related nuclear cataract, bilateral: Secondary | ICD-10-CM | POA: Diagnosis not present

## 2022-07-27 DIAGNOSIS — H16223 Keratoconjunctivitis sicca, not specified as Sjogren's, bilateral: Secondary | ICD-10-CM | POA: Diagnosis not present

## 2022-10-13 DIAGNOSIS — M9904 Segmental and somatic dysfunction of sacral region: Secondary | ICD-10-CM | POA: Diagnosis not present

## 2022-10-13 DIAGNOSIS — M9901 Segmental and somatic dysfunction of cervical region: Secondary | ICD-10-CM | POA: Diagnosis not present

## 2022-10-13 DIAGNOSIS — M9903 Segmental and somatic dysfunction of lumbar region: Secondary | ICD-10-CM | POA: Diagnosis not present

## 2022-10-13 DIAGNOSIS — R293 Abnormal posture: Secondary | ICD-10-CM | POA: Diagnosis not present

## 2022-10-13 DIAGNOSIS — M25512 Pain in left shoulder: Secondary | ICD-10-CM | POA: Diagnosis not present

## 2022-10-13 DIAGNOSIS — M62830 Muscle spasm of back: Secondary | ICD-10-CM | POA: Diagnosis not present

## 2022-10-31 DIAGNOSIS — M60812 Other myositis, left shoulder: Secondary | ICD-10-CM | POA: Diagnosis not present

## 2022-10-31 DIAGNOSIS — M5451 Vertebrogenic low back pain: Secondary | ICD-10-CM | POA: Diagnosis not present

## 2022-10-31 DIAGNOSIS — M9904 Segmental and somatic dysfunction of sacral region: Secondary | ICD-10-CM | POA: Diagnosis not present

## 2022-10-31 DIAGNOSIS — R293 Abnormal posture: Secondary | ICD-10-CM | POA: Diagnosis not present

## 2022-10-31 DIAGNOSIS — M9901 Segmental and somatic dysfunction of cervical region: Secondary | ICD-10-CM | POA: Diagnosis not present

## 2022-10-31 DIAGNOSIS — M9903 Segmental and somatic dysfunction of lumbar region: Secondary | ICD-10-CM | POA: Diagnosis not present

## 2022-10-31 DIAGNOSIS — M25551 Pain in right hip: Secondary | ICD-10-CM | POA: Diagnosis not present

## 2022-10-31 DIAGNOSIS — M62830 Muscle spasm of back: Secondary | ICD-10-CM | POA: Diagnosis not present

## 2022-11-08 DIAGNOSIS — Z853 Personal history of malignant neoplasm of breast: Secondary | ICD-10-CM | POA: Diagnosis not present

## 2022-11-08 DIAGNOSIS — R92332 Mammographic heterogeneous density, left breast: Secondary | ICD-10-CM | POA: Diagnosis not present

## 2022-11-13 LAB — HM MAMMOGRAPHY

## 2022-11-15 DIAGNOSIS — E559 Vitamin D deficiency, unspecified: Secondary | ICD-10-CM | POA: Diagnosis not present

## 2022-11-15 DIAGNOSIS — R799 Abnormal finding of blood chemistry, unspecified: Secondary | ICD-10-CM | POA: Diagnosis not present

## 2022-11-21 DIAGNOSIS — Z08 Encounter for follow-up examination after completed treatment for malignant neoplasm: Secondary | ICD-10-CM | POA: Diagnosis not present

## 2022-11-21 DIAGNOSIS — Z853 Personal history of malignant neoplasm of breast: Secondary | ICD-10-CM | POA: Diagnosis not present

## 2022-12-13 ENCOUNTER — Ambulatory Visit (INDEPENDENT_AMBULATORY_CARE_PROVIDER_SITE_OTHER): Payer: Medicare PPO

## 2022-12-13 VITALS — Wt 128.0 lb

## 2022-12-13 DIAGNOSIS — Z0000 Encounter for general adult medical examination without abnormal findings: Secondary | ICD-10-CM | POA: Diagnosis not present

## 2022-12-13 NOTE — Progress Notes (Signed)
I connected with  Judy Sampson on 12/13/22 by a audio enabled telemedicine application and verified that I am speaking with the correct person using two identifiers.  Patient Location: Home  Provider Location: Office/Clinic  I discussed the limitations of evaluation and management by telemedicine. The patient expressed understanding and agreed to proceed.  Subjective:   Judy Sampson is a 77 y.o. female who presents for Medicare Annual (Subsequent) preventive examination.  Review of Systems     Cardiac Risk Factors include: advanced age (>58mn, >>51women)     Objective:    There were no vitals filed for this visit. There is no height or weight on file to calculate BMI.     12/13/2022    8:32 AM 12/07/2021    8:37 AM 12/06/2020    8:59 AM 12/03/2019    8:55 AM 11/27/2018    9:08 AM 11/21/2017    8:59 AM 10/04/2016    8:26 AM  Advanced Directives  Does Patient Have a Medical Advance Directive? Yes Yes Yes Yes Yes No No  Type of AParamedicof [place]Living will [place] will [place] will [place] will Living will;Healthcare Power of Attorney    Does patient want to make changes to medical advance directive? No - Patient declined        Copy of [place] Chart? Yes - validated most recent copy scanned in chart (See row information) Yes - validated most recent copy scanned in chart (See row information) Yes - validated most recent copy scanned in chart (See row information) Yes - validated most recent copy scanned in chart (See row information) Yes - validated most recent copy scanned in chart (See row information)    Would patient like information on creating a medical advance directive?      Yes (MAU/Ambulatory/Procedural Areas - Information given)     Current Medications (verified) Outpatient Encounter Medications as of 12/13/2022  Medication Sig    Calcium-Magnesium-Vitamin D (CALCIUM MAGNESIUM PO) Take 4 tablets by mouth daily.    Omega-3 Fatty Acids (FISH OIL BURP-LESS) 1000 MG CAPS Take by mouth.   sertraline (ZOLOFT) 50 MG tablet TAKE 1 TABLET(50 MG) BY MOUTH DAILY   valACYclovir (VALTREX) 1000 MG tablet Take 1 tablet (1,000 mg total) by mouth 2 (two) times daily as needed.   Flaxseed, Linseed, (FLAX SEED OIL) 1000 MG CAPS Take by mouth. (Patient not taking: Reported on 12/13/2022)   ondansetron (ZOFRAN ODT) 4 MG disintegrating tablet Take 1 tablet (4 mg total) by mouth every 8 (eight) hours as needed for nausea or vomiting. (Patient not taking: Reported on 12/13/2022)   No facility-administered encounter medications on file as of 12/13/2022.    Allergies (verified) Scopolamine and Bisphosphonates   History: Past Medical History:  Diagnosis Date   Anxiety    Cancer ([place]    breast   Claustrophobia    Depression    GERD (gastroesophageal reflux disease)    H/O cold sores    Malignant neoplasm of left female breast, unspecified estrogen receptor status, unspecified site of breast [place] 12/22/2020   Past Surgical History:  Procedure Laterality Date   FOOT SURGERY Bilateral    MASTECTOMY MODIFIED RADICAL Right 1998   XRT/Chemo + 5 yrs  Aromasin   OOPHORECTOMY Left    Family History  Problem Relation Age of Onset   Diabetes Mother    Parkinson's disease Mother    Breast cancer Mother  40   Heart disease Father    Parkinson's disease Brother    Social History   Socioeconomic History   Marital status: Divorced    Spouse name: Not on file   Number of children: 0   Years of education: Not on file   Highest education level: Master's degree (e.g., MA, MS, MEng, MEd, MSW, MBA)  Occupational History   Occupation: Retired  Tobacco Use   Smoking status: Never   Smokeless tobacco: Never   Tobacco comments:    smoking cessation materials not required  Vaping Use   Vaping Use: Never used  Substance and Sexual  Activity   Alcohol use: Yes    Alcohol/week: 2.0 standard drinks of alcohol    Types: 2 Standard drinks or equivalent per week    Comment: occasional   Drug use: No   Sexual activity: Not Currently  Other Topics Concern   Not on file  Social History Narrative   Not on file   Social Determinants of Health   Financial Resource Strain: Low Risk  (12/13/2022)   Overall Financial Resource Strain (CARDIA)    Difficulty of Paying Living Expenses: Not hard at all  Food Insecurity: No Food Insecurity (12/13/2022)   Hunger Vital Sign    Worried About Running Out of Food in the Last Year: Never true    Ran Out of Food in the Last Year: Never true  Transportation Needs: No Transportation Needs (12/13/2022)   PRAPARE - Hydrologist (Medical): No    Lack of Transportation (Non-Medical): No  Physical Activity: Sufficiently Active (12/13/2022)   Exercise Vital Sign    Days of Exercise per Week: 7 days    Minutes of Exercise per Session: 60 min  Stress: No Stress Concern Present (12/13/2022)   [place]    Feeling of Stress : Not at all  Social Connections: Moderately Integrated (12/13/2022)   Social Connection and Isolation Panel [NHANES]    Frequency of Communication with Friends and Family: More than three times a week    Frequency of Social Gatherings with Friends and Family: More than three times a week    Attends Religious Services: More than 4 times per year    Active Member of [employer] or Organizations: Yes    Attends Music therapist: More than 4 times per year    Marital Status: Divorced    Tobacco Counseling Counseling given: Not Answered Tobacco comments: smoking cessation materials not required   Clinical Intake:  Pre-visit preparation completed: Yes  Pain : No/denies pain     Nutritional Risks: None Diabetes: No  How often do you need to have someone help you when  you read instructions, pamphlets, or other written materials from your doctor or pharmacy?: 1 - Never  Diabetic?no  Interpreter Needed?: No  Information entered by :: Kirke Shaggy, LPN   Activities of Daily Living    12/13/2022    8:33 AM 12/28/2021    8:44 AM  In your present state of health, do you have any difficulty performing the following activities:  Hearing? 0 0  Vision? 0 0  Difficulty concentrating or making decisions? 0 0  Walking or climbing stairs? 0 0  Dressing or bathing? 0 0  Doing errands, shopping? 0 0  Preparing Food and eating ? N   Using the Toilet? N   In the past six months, have you accidently leaked urine? N  Do you have problems with loss of bowel control? N   Managing your Medications? N   Managing your Finances? N   Housekeeping or managing your Housekeeping? N     Patient Care Team: Glean Hess, MD as PCP - General (Internal Medicine) Dr. Enos Fling (Obstetrics and Gynecology) Laveda Norman, MD as Consulting Physician (Hematology and Oncology) [facility] (Dermatology)  Indicate any recent Medical Services you may have received from other than [facility] providers in the past year (date may be approximate).     Assessment:   This is a routine wellness examination for [facility].  Hearing/Vision screen Hearing Screening - Comments:: No aids Vision Screening - Comments:: Wears glasses- Dr.Tesser  Dietary issues and exercise activities discussed: Current Exercise Habits: Home exercise routine;Structured exercise class, Type of exercise: yoga;walking;stretching, Time (Minutes): 60, Frequency (Times/Week): 7, Weekly Exercise (Minutes/Week): 420, Intensity: Moderate   Goals Addressed             This Visit's Progress    DIET - EAT MORE FRUITS AND VEGETABLES         Depression Screen    12/13/2022    8:31 AM 12/28/2021    8:44 AM 12/07/2021    8:36 AM 03/25/2021    1:22 PM 12/22/2020    8:34 AM 12/06/2020    8:59 AM 12/03/2019     8:54 AM  PHQ 2/9 Scores  PHQ - 2 Score 0 0 0 0 0 0 0  PHQ- 9 Score 0 0  0 0      Fall Risk    12/13/2022    8:33 AM 12/28/2021    8:44 AM 12/07/2021    8:38 AM 03/25/2021    1:23 PM 12/22/2020    8:33 AM  Fall Risk   Falls in the past year? 0 0 0 0 0  Number falls in past yr: 0 0 0  0  Injury with Fall? 0 0 0  0  Risk for fall due to : No Fall Risks No Fall Risks No Fall Risks    Follow up Falls prevention discussed;Falls evaluation completed Falls evaluation completed Falls prevention discussed Falls evaluation completed Falls evaluation completed    FALL RISK PREVENTION PERTAINING TO THE HOME:  Any stairs in or around the home? Yes  If so, are there any without handrails? No  Home free of loose throw rugs in walkways, pet beds, electrical cords, etc? Yes  Adequate lighting in your home to reduce risk of falls? Yes   ASSISTIVE DEVICES UTILIZED TO PREVENT FALLS:  Life alert? No  Use of a cane, walker or w/c? No  Grab bars in the bathroom? Yes  Shower chair or bench in shower? No  Elevated toilet seat or a handicapped toilet? No    Cognitive Function:        12/13/2022    8:39 AM 11/27/2018    9:18 AM 11/21/2017    9:04 AM 10/04/2016    8:29 AM  6CIT Screen  What Year? 0 points 0 points 0 points 0 points  What month? 0 points 0 points 0 points 0 points  What time? 0 points 0 points 0 points 0 points  Count back from 20 0 points 0 points 0 points 0 points  Months in reverse 0 points 0 points 0 points 0 points  Repeat phrase 0 points 0 points 0 points 0 points  Total Score 0 points 0 points 0 points 0 points    Immunizations Immunization History  Administered Date(s) Administered   Influenza, High Dose Seasonal PF 07/29/2020   Influenza-Unspecified 07/30/2014, 08/21/2018, 07/20/2019, 08/22/2021   PFIZER(Purple Top)SARS-COV-2 Vaccination 10/15/2019, 11/05/2019, 07/29/2020, 01/29/2021, 08/28/2022   Pfizer Covid-19 Vaccine Bivalent Booster 23yr & up 08/22/2021    Pneumococcal Conjugate-13 11/19/2014   Pneumococcal Polysaccharide-23 11/16/2015   Pneumococcal-Unspecified 07/30/2017   Respiratory Syncytial Virus Vaccine,Recomb Aduvanted(Arexvy) 11/03/2022   Tdap 08/31/2015, 10/29/2018   Zoster Recombinat (Shingrix) 06/06/2018, 08/13/2018   Zoster, Live 11/01/2007, 08/21/2017, 01/10/2019    TDAP status: Up to date  Flu Vaccine status: Up to date  Pneumococcal vaccine status: Up to date  Covid-19 vaccine status: Completed vaccines  Qualifies for Shingles Vaccine? Yes   Zostavax completed Yes   Shingrix Completed?: Yes  Screening Tests Health Maintenance  Topic Date Due   INFLUENZA VACCINE  05/30/2022   COVID-19 Vaccine (7 - 2023-24 season) 10/23/2022   MAMMOGRAM  11/17/2022   COLONOSCOPY (Pts 45-462yrInsurance coverage will need to be confirmed)  12/24/2022   Medicare Annual Wellness (AWV)  12/14/2023   DTaP/Tdap/Td (3 - Td or Tdap) 10/29/2028   Pneumonia Vaccine 6558Years old  Completed   DEXA SCAN  Completed   Hepatitis C Screening  Completed   Zoster Vaccines- Shingrix  Completed   HPV VACCINES  Aged Out    Health Maintenance  Health Maintenance Due  Topic Date Due   INFLUENZA VACCINE  05/30/2022   COVID-19 Vaccine (7 - 2023-24 season) 10/23/2022   MAMMOGRAM  11/17/2022  Had RSV shot 11/03/22  Colorectal cancer screening: Type of screening: Colonoscopy. Completed 12/24/17. Repeat every 5 years- will be traveling in March- will take to MD   Mammogram status: Completed 11/17/21. Repeat every year- had one in January and saw oncology  Bone Density status: Completed 11/16/21. Results reflect: Bone density results: OSTEOPOROSIS. Repeat every 2 years.  Lung Cancer Screening: (Low Dose CT Chest recommended if Age 75-80ears, 30 pack-year currently smoking OR have quit w/in 15years.) does not qualify.   Additional Screening:  Hepatitis C Screening: does qualify; Completed 10/04/16  Vision Screening: Recommended annual ophthalmology  exams for early detection of glaucoma and other disorders of the eye. Is the patient up to date with their annual eye exam?  Yes  Who is the provider or what is the name of the office in which the patient attends annual eye exams? Dr.Tesser If pt is not established with a provider, would they like to be referred to a provider to establish care? No .   Dental Screening: Recommended annual dental exams for proper oral hygiene  Community Resource Referral / Chronic Care Management: CRR required this visit?  No   CCM required this visit?  No      Plan:     I have personally reviewed and noted the following in the patient's chart:   Medical and social history Use of alcohol, tobacco or illicit drugs  Current medications and supplements including opioid prescriptions. Patient is not currently taking opioid prescriptions. Functional ability and status Nutritional status Physical activity Advanced directives List of other physicians Hospitalizations, surgeries, and ER visits in previous 12 months Vitals Screenings to include cognitive, depression, and falls Referrals and appointments  In addition, I have reviewed and discussed with patient certain preventive protocols, quality metrics, and best practice recommendations. A written personalized care plan for preventive services as well as general preventive health recommendations were provided to patient.     LoDionisio DavidLPN   [id number] Nurse Notes: none

## 2022-12-13 NOTE — Patient Instructions (Signed)
Judy Sampson , Thank you for taking time to come for your Medicare Wellness Visit. I appreciate your ongoing commitment to your health goals. Please review the following plan we discussed and let me know if I can assist you in the future.   These are the goals we discussed:  Goals      DIET - EAT MORE FRUITS AND VEGETABLES     DIET - INCREASE WATER INTAKE     Recommend to drink at least 6-8 8oz glasses of water per day.      Patient Stated     Continue healthy diet and exercise for cholesterol and weight management.         This is a list of the screening recommended for you and due dates:  Health Maintenance  Topic Date Due   Flu Shot  05/30/2022   COVID-19 Vaccine (7 - 2023-24 season) 10/23/2022   Mammogram  11/17/2022   Colon Cancer Screening  12/24/2022   Medicare Annual Wellness Visit  12/14/2023   DTaP/Tdap/Td vaccine (3 - Td or Tdap) 10/29/2028   Pneumonia Vaccine  Completed   DEXA scan (bone density measurement)  Completed   Hepatitis C Screening: USPSTF Recommendation to screen - Ages 20-79 yo.  Completed   Zoster (Shingles) Vaccine  Completed   HPV Vaccine  Aged Out    Advanced directives: no  Conditions/risks identified: none  Next appointment: Follow up in one year for your annual wellness visit 12/19/23 @ 8:15 am by phone   Preventive Care 65 Years and Older, Female Preventive care refers to lifestyle choices and visits with your health care provider that can promote health and wellness. What does preventive care include? A yearly physical exam. This is also called an annual well check. Dental exams once or twice a year. Routine eye exams. Ask your health care provider how often you should have your eyes checked. Personal lifestyle choices, including: Daily care of your teeth and gums. Regular physical activity. Eating a healthy diet. Avoiding tobacco and drug use. Limiting alcohol use. Practicing safe sex. Taking low-dose aspirin every day. Taking  vitamin and mineral supplements as recommended by your health care provider. What happens during an annual well check? The services and screenings done by your health care provider during your annual well check will depend on your age, overall health, lifestyle risk factors, and family history of disease. Counseling  Your health care provider may ask you questions about your: Alcohol use. Tobacco use. Drug use. Emotional well-being. Home and relationship well-being. Sexual activity. Eating habits. History of falls. Memory and ability to understand (cognition). Work and work Statistician. Reproductive health. Screening  You may have the following tests or measurements: Height, weight, and BMI. Blood pressure. Lipid and cholesterol levels. These may be checked every 5 years, or more frequently if you are over 60 years old. Skin check. Lung cancer screening. You may have this screening every year starting at age 19 if you have a 30-pack-year history of smoking and currently smoke or have quit within the past 15 years. Fecal occult blood test (FOBT) of the stool. You may have this test every year starting at age 60. Flexible sigmoidoscopy or colonoscopy. You may have a sigmoidoscopy every 5 years or a colonoscopy every 10 years starting at age 72. Hepatitis C blood test. Hepatitis B blood test. Sexually transmitted disease (STD) testing. Diabetes screening. This is done by checking your blood sugar (glucose) after you have not eaten for a while (fasting). You may  have this done every 1-3 years. Bone density scan. This is done to screen for osteoporosis. You may have this done starting at age 35. Mammogram. This may be done every 1-2 years. Talk to your health care provider about how often you should have regular mammograms. Talk with your health care provider about your test results, treatment options, and if necessary, the need for more tests. Vaccines  Your health care provider may  recommend certain vaccines, such as: Influenza vaccine. This is recommended every year. Tetanus, diphtheria, and acellular pertussis (Tdap, Td) vaccine. You may need a Td booster every 10 years. Zoster vaccine. You may need this after age 32. Pneumococcal 13-valent conjugate (PCV13) vaccine. One dose is recommended after age 4. Pneumococcal polysaccharide (PPSV23) vaccine. One dose is recommended after age 74. Talk to your health care provider about which screenings and vaccines you need and how often you need them. This information is not intended to replace advice given to you by your health care provider. Make sure you discuss any questions you have with your health care provider. Document Released: 11/12/2015 Document Revised: 07/05/2016 Document Reviewed: 08/17/2015 Elsevier Interactive Patient Education  2017 [place] Prevention in the Home Falls can cause injuries. They can happen to people of all ages. There are many things you can do to make your home safe and to help prevent falls. What can I do on the outside of my home? Regularly fix the edges of walkways and driveways and fix any cracks. Remove anything that might make you trip as you walk through a door, such as a raised step or threshold. Trim any bushes or trees on the path to your home. Use bright outdoor lighting. Clear any walking paths of anything that might make someone trip, such as rocks or tools. Regularly check to see if handrails are loose or broken. Make sure that both sides of any steps have handrails. Any raised decks and porches should have guardrails on the edges. Have any leaves, snow, or ice cleared regularly. Use sand or salt on walking paths during winter. Clean up any spills in your garage right away. This includes oil or grease spills. What can I do in the bathroom? Use night lights. Install grab bars by the toilet and in the tub and shower. Do not use towel bars as grab bars. Use non-skid  mats or decals in the tub or shower. If you need to sit down in the shower, use a plastic, non-slip stool. Keep the floor dry. Clean up any water that spills on the floor as soon as it happens. Remove soap buildup in the tub or shower regularly. Attach bath mats securely with double-sided non-slip rug tape. Do not have throw rugs and other things on the floor that can make you trip. What can I do in the bedroom? Use night lights. Make sure that you have a light by your bed that is easy to reach. Do not use any sheets or blankets that are too big for your bed. They should not hang down onto the floor. Have a firm chair that has side arms. You can use this for support while you get dressed. Do not have throw rugs and other things on the floor that can make you trip. What can I do in the kitchen? Clean up any spills right away. Avoid walking on wet floors. Keep items that you use a lot in easy-to-reach places. If you need to reach something above you, use a strong step  stool that has a grab bar. Keep electrical cords out of the way. Do not use floor polish or wax that makes floors slippery. If you must use wax, use non-skid floor wax. Do not have throw rugs and other things on the floor that can make you trip. What can I do with my stairs? Do not leave any items on the stairs. Make sure that there are handrails on both sides of the stairs and use them. Fix handrails that are broken or loose. Make sure that handrails are as long as the stairways. Check any carpeting to make sure that it is firmly attached to the stairs. Fix any carpet that is loose or worn. Avoid having throw rugs at the top or bottom of the stairs. If you do have throw rugs, attach them to the floor with carpet tape. Make sure that you have a light switch at the top of the stairs and the bottom of the stairs. If you do not have them, ask someone to add them for you. What else can I do to help prevent falls? Wear shoes  that: Do not have high heels. Have rubber bottoms. Are comfortable and fit you well. Are closed at the toe. Do not wear sandals. If you use a stepladder: Make sure that it is fully opened. Do not climb a closed stepladder. Make sure that both sides of the stepladder are locked into place. Ask someone to hold it for you, if possible. Clearly mark and make sure that you can see: Any grab bars or handrails. First and last steps. Where the edge of each step is. Use tools that help you move around (mobility aids) if they are needed. These include: Canes. Walkers. Scooters. Crutches. Turn on the lights when you go into a dark area. Replace any light bulbs as soon as they burn out. Set up your furniture so you have a clear path. Avoid moving your furniture around. If any of your floors are uneven, fix them. If there are any pets around you, be aware of where they are. Review your medicines with your doctor. Some medicines can make you feel dizzy. This can increase your chance of falling. Ask your doctor what other things that you can do to help prevent falls. This information is not intended to replace advice given to you by your health care provider. Make sure you discuss any questions you have with your health care provider. Document Released: 08/12/2009 Document Revised: 03/23/2016 Document Reviewed: 11/20/2014 Elsevier Interactive Patient Education  2017 [employer].

## 2022-12-31 NOTE — Progress Notes (Signed)
Date:  01/01/2023   Name:  Judy Sampson   DOB:  Oct 04, 1946   MRN:  [id number]   Chief Complaint: Annual Exam (No Breast exam see oncology-no pap ) Judy Sampson is a 77 y.o. female who presents today for her Complete Annual Exam. She feels well. She reports exercising walking daily, yoga 3 times a week and gardening. She reports she is sleeping well. Breast complaints none.  Mammogram: 10/2022 Oncology DEXA: 10/2021 s/p Zometa Pap smear: discontinued Colonoscopy: 11/2017 repeat 5 yrs  Health Maintenance Due  Topic Date Due   COVID-19 Vaccine (7 - 2023-24 season) 10/23/2022   COLONOSCOPY (Pts 45-42yrs Insurance coverage will need to be confirmed)  12/24/2022    Immunization History  Administered Date(s) Administered   Fluad Quad(high Dose 65+) 07/09/2022   Influenza, High Dose Seasonal PF 07/29/2020   Influenza-Unspecified 07/30/2014, 08/21/2018, 07/20/2019, 08/22/2021   PFIZER(Purple Top)SARS-COV-2 Vaccination 10/15/2019, 11/05/2019, 07/29/2020, 01/29/2021, 08/28/2022   Pfizer Covid-19 Vaccine Bivalent Booster 33yrs & up 08/22/2021   Pneumococcal Conjugate-13 11/19/2014   Pneumococcal Polysaccharide-23 11/16/2015   Pneumococcal-Unspecified 07/30/2017   Respiratory Syncytial Virus Vaccine,Recomb Aduvanted(Arexvy) 11/03/2022   Tdap 08/31/2015, 10/29/2018   Zoster Recombinat (Shingrix) 06/06/2018, 08/13/2018   Zoster, Live 11/01/2007, 08/21/2017, 01/10/2019    Anxiety Presents for follow-up visit. Patient reports no chest pain, dizziness, nervous/anxious behavior, palpitations or shortness of breath. Symptoms occur occasionally. The quality of sleep is good.   Compliance with medications is 76-100%.    Lab Results  Component Value Date   NA 140 12/28/2021   K 5.1 12/28/2021   CO2 24 12/28/2021   GLUCOSE 84 12/28/2021   BUN 16 12/28/2021   CREATININE 0.88 12/28/2021   CALCIUM 9.3 12/28/2021   EGFR 69 12/28/2021   GFRNONAA 60 11/16/2020   Lab Results  Component Value  Date   CHOL 221 (H) 12/28/2021   HDL 74 12/28/2021   LDLCALC 138 (H) 12/28/2021   TRIG 52 12/28/2021   CHOLHDL 3.0 12/28/2021   Lab Results  Component Value Date   TSH 0.997 12/28/2021   No results found for: "HGBA1C" Lab Results  Component Value Date   WBC 5.9 12/28/2021   HGB 13.2 12/28/2021   HCT 38.8 12/28/2021   MCV 88 12/28/2021   PLT 181 12/28/2021   Lab Results  Component Value Date   ALT 16 12/28/2021   AST 22 12/28/2021   ALKPHOS 72 12/28/2021   BILITOT 0.5 12/28/2021   Lab Results  Component Value Date   VD25OH 34.5 12/28/2021     Review of Systems  Constitutional:  Negative for chills, fatigue and fever.  HENT:  Negative for congestion, hearing loss, tinnitus, trouble swallowing and voice change.   Eyes:  Negative for visual disturbance.  Respiratory:  Negative for cough, chest tightness, shortness of breath and wheezing.   Cardiovascular:  Negative for chest pain, palpitations and leg swelling.  Gastrointestinal:  Negative for abdominal pain, constipation, diarrhea and vomiting.  Endocrine: Negative for polydipsia and polyuria.  Genitourinary:  Negative for dysuria, frequency, genital sores, vaginal bleeding and vaginal discharge.  Musculoskeletal:  Negative for arthralgias, gait problem and joint swelling.  Skin:  Negative for color change and rash.  Neurological:  Negative for dizziness, tremors, light-headedness and headaches.  Hematological:  Negative for adenopathy. Does not bruise/bleed easily.  Psychiatric/Behavioral:  Negative for dysphoric mood and sleep disturbance. The patient is not nervous/anxious.     Patient Active Problem List   Diagnosis Date Noted   Mild hyperlipidemia 12/22/2020  Generalized anxiety disorder 11/20/2019   Vitamin D deficiency 11/20/2019   Adenomatous polyp of colon 07/03/2017   Muscle spasms of neck 10/04/2016   Cardiac arrhythmia 10/04/2016   Hx of herpes genitalis 05/26/2015   Osteoporosis 05/26/2015    Claustrophobia 05/26/2015   Acid reflux 05/26/2015   Personal history of malignant neoplasm of breast 10/30/1996    Allergies  Allergen Reactions   Scopolamine Nausea And Vomiting    headache   Bisphosphonates     Past Surgical History:  Procedure Laterality Date   FOOT SURGERY Bilateral    MASTECTOMY MODIFIED RADICAL Right 1998   XRT/Chemo + 5 yrs  Aromasin   OOPHORECTOMY Left     Social History   Tobacco Use   Smoking status: Never   Smokeless tobacco: Never   Tobacco comments:    smoking cessation materials not required  Vaping Use   Vaping Use: Never used  Substance Use Topics   Alcohol use: Yes    Alcohol/week: 2.0 standard drinks of alcohol    Types: 2 Standard drinks or equivalent per week    Comment: occasional   Drug use: No     Medication list has been reviewed and updated.  Current Meds  Medication Sig   Calcium-Magnesium-Vitamin D (CALCIUM MAGNESIUM PO) Take 4 tablets by mouth daily.    Omega-3 Fatty Acids (FISH OIL BURP-LESS) 1000 MG CAPS Take by mouth.   valACYclovir (VALTREX) 1000 MG tablet Take 1 tablet (1,000 mg total) by mouth 2 (two) times daily as needed.   [DISCONTINUED] clonazePAM (KLONOPIN) 0.5 MG tablet TK 1/2 T PO BID PRF ANXIETY   [DISCONTINUED] sertraline (ZOLOFT) 50 MG tablet TAKE 1 TABLET(50 MG) BY MOUTH DAILY       01/01/2023    8:32 AM 12/28/2021    8:44 AM 03/25/2021    1:23 PM 12/22/2020    8:34 AM  GAD 7 : Generalized Anxiety Score  Nervous, Anxious, on Edge 1 0 1 0  Control/stop worrying 0 0 0 0  Worry too much - different things 1 0 0 0  Trouble relaxing 0 0 0 0  Restless 0 0 0 0  Easily annoyed or irritable 0 0 0 0  Afraid - awful might happen 0 0 0 0  Total GAD 7 Score 2 0 1 0  Anxiety Difficulty Not difficult at all Not difficult at all  Not difficult at all       01/01/2023    8:32 AM 12/13/2022    8:31 AM 12/28/2021    8:44 AM  Depression screen PHQ 2/9  Decreased Interest 0 0 0  Down, Depressed, Hopeless 0 0 0   PHQ - 2 Score 0 0 0  Altered sleeping 0 0 0  Tired, decreased energy 1 0 0  Change in appetite 0 0 0  Feeling bad or failure about yourself  0 0 0  Trouble concentrating 0 0 0  Moving slowly or fidgety/restless 0 0 0  Suicidal thoughts 0 0 0  PHQ-9 Score 1 0 0  Difficult doing work/chores Not difficult at all Not difficult at all Not difficult at all    BP Readings from Last 3 Encounters:  01/01/23 112/78  12/28/21 118/70  03/25/21 124/86    Physical Exam Vitals and nursing note reviewed.  Constitutional:      General: She is not in acute distress.    Appearance: She is well-developed.  HENT:     Head: Normocephalic and atraumatic.  Right Ear: Tympanic membrane and ear canal normal.     Left Ear: Tympanic membrane and ear canal normal.     Nose:     Right Sinus: No maxillary sinus tenderness.     Left Sinus: No maxillary sinus tenderness.  Eyes:     General: No scleral icterus.       Right eye: No discharge.        Left eye: No discharge.     Conjunctiva/sclera: Conjunctivae normal.  Neck:     Thyroid: No thyromegaly.     Vascular: No carotid bruit.  Cardiovascular:     Rate and Rhythm: Normal rate and regular rhythm.     Pulses: Normal pulses.     Heart sounds: Normal heart sounds.  Pulmonary:     Effort: Pulmonary effort is normal. No respiratory distress.     Breath sounds: No wheezing.  Abdominal:     General: Bowel sounds are normal.     Palpations: Abdomen is soft.     Tenderness: There is no abdominal tenderness.  Musculoskeletal:     Cervical back: Normal range of motion. No erythema.     Right lower leg: No edema.     Left lower leg: No edema.  Lymphadenopathy:     Cervical: No cervical adenopathy.  Skin:    General: Skin is warm and dry.     Findings: No rash.  Neurological:     Mental Status: She is alert and oriented to person, place, and time.     Cranial Nerves: No cranial nerve deficit.     Sensory: No sensory deficit.     Deep Tendon  Reflexes: Reflexes are normal and symmetric.  Psychiatric:        Attention and Perception: Attention normal.        Mood and Affect: Mood normal.     Wt Readings from Last 3 Encounters:  01/01/23 133 lb (60.3 kg)  12/13/22 128 lb (58.1 kg)  12/28/21 128 lb (58.1 kg)    BP 112/78   Pulse 74   Ht 5\' 8"  (1.727 m)   Wt 133 lb (60.3 kg)   SpO2 96%   BMI 20.22 kg/m   Assessment and Plan: Problem List Items Addressed This Visit       Musculoskeletal and Integument   Osteoporosis    Will continue calcium and vitamin D daily Exercise Follow up DEXA per Oncology      Relevant Orders   VITAMIN D 25 Hydroxy (Vit-D Deficiency, Fractures)     Other   Generalized anxiety disorder    Clinically stable on current regimen with good control of symptoms, No SI or HI. No change in management at this time. Zoloft 50 mg daily. Will give a few clonazepam for air travel anxiety - going to [place] soon for a new grand baby.      Relevant Medications   sertraline (ZOLOFT) 50 MG tablet   Other Relevant Orders   TSH   Mild hyperlipidemia    LDL is  Lab Results  Component Value Date   LDLCALC 138 (H) 12/28/2021  On diet alone.  10 yr risk is elevated. She declines statins - on Omega-3 FA supplements       Relevant Orders   Lipid panel   Personal history of malignant neoplasm of breast    Completed course of AI therapy x 5 yrs Mammograms ordered by Oncology.      Relevant Orders   CBC with Differential/Platelet  Comprehensive metabolic panel   Other Visit Diagnoses     Annual physical exam    -  Primary   Colon cancer screening       Relevant Orders   Ambulatory referral to Gastroenterology        Partially dictated using Dragon software. Any errors are unintentional.  Bari Edward, MD [facility]  01/01/2023

## 2022-12-31 NOTE — Assessment & Plan Note (Signed)
LDL is  Lab Results  Component Value Date   LDLCALC 138 (H) 12/28/2021  On diet alone.  10 yr risk is elevated. She declines statins - on Omega-3 FA supplements

## 2022-12-31 NOTE — Assessment & Plan Note (Signed)
Completed course of AI therapy x 5 yrs Mammograms ordered by Oncology.

## 2022-12-31 NOTE — Assessment & Plan Note (Signed)
Will continue calcium and vitamin D daily Exercise Follow up DEXA per Oncology

## 2022-12-31 NOTE — Assessment & Plan Note (Signed)
Clinically stable on current regimen with good control of symptoms, No SI or HI. No change in management at this time. Zoloft 50 mg daily. Will give a few clonazepam for air travel anxiety - going to [place] soon for a new grand baby.

## 2023-01-01 ENCOUNTER — Ambulatory Visit (INDEPENDENT_AMBULATORY_CARE_PROVIDER_SITE_OTHER): Payer: Medicare PPO | Admitting: Internal Medicine

## 2023-01-01 ENCOUNTER — Encounter: Payer: Self-pay | Admitting: Internal Medicine

## 2023-01-01 VITALS — BP 112/78 | HR 74 | Ht 68.0 in | Wt 133.0 lb

## 2023-01-01 DIAGNOSIS — E785 Hyperlipidemia, unspecified: Secondary | ICD-10-CM

## 2023-01-01 DIAGNOSIS — Z0000 Encounter for general adult medical examination without abnormal findings: Secondary | ICD-10-CM | POA: Diagnosis not present

## 2023-01-01 DIAGNOSIS — Z1211 Encounter for screening for malignant neoplasm of colon: Secondary | ICD-10-CM | POA: Diagnosis not present

## 2023-01-01 DIAGNOSIS — M810 Age-related osteoporosis without current pathological fracture: Secondary | ICD-10-CM

## 2023-01-01 DIAGNOSIS — F411 Generalized anxiety disorder: Secondary | ICD-10-CM

## 2023-01-01 DIAGNOSIS — Z853 Personal history of malignant neoplasm of breast: Secondary | ICD-10-CM | POA: Diagnosis not present

## 2023-01-01 MED ORDER — SERTRALINE HCL 50 MG PO TABS: ORAL_TABLET | ORAL | 1 refills | Status: DC

## 2023-01-01 MED ORDER — CLONAZEPAM 0.5 MG PO TABS: ORAL_TABLET | ORAL | 0 refills | Status: DC

## 2023-01-02 LAB — LIPID PANEL
Chol/HDL Ratio: 2.9 ratio (ref 0.0–4.4)
Cholesterol, Total: 228 mg/dL — ABNORMAL HIGH (ref 100–199)
HDL: 79 mg/dL (ref >39)
LDL Chol Calc (NIH): 139 mg/dL — ABNORMAL HIGH (ref 0–99)
Triglycerides: 61 mg/dL (ref 0–149)
VLDL Cholesterol Cal: 10 mg/dL (ref 5–40)

## 2023-01-02 LAB — CBC WITH DIFFERENTIAL/PLATELET
Basophils Absolute: 0 10*3/uL (ref 0.0–0.2)
Basos: 1 %
EOS (ABSOLUTE): 0.1 10*3/uL (ref 0.0–0.4)
Eos: 2 %
Hematocrit: 40.7 % (ref 34.0–46.6)
Hemoglobin: 13.3 g/dL (ref 11.1–15.9)
Immature Grans (Abs): 0 10*3/uL (ref 0.0–0.1)
Immature Granulocytes: 0 %
Lymphocytes Absolute: 1.7 10*3/uL (ref 0.7–3.1)
Lymphs: 37 %
MCH: 29 pg (ref 26.6–33.0)
MCHC: 32.7 g/dL (ref 31.5–35.7)
MCV: 89 fL (ref 79–97)
Monocytes Absolute: 0.4 10*3/uL (ref 0.1–0.9)
Monocytes: 9 %
Neutrophils Absolute: 2.4 10*3/uL (ref 1.4–7.0)
Neutrophils: 51 %
Platelets: 162 10*3/uL (ref 150–450)
RBC: 4.58 x10E6/uL (ref 3.77–5.28)
RDW: 12.2 % (ref 11.7–15.4)
WBC: 4.6 10*3/uL (ref 3.4–10.8)

## 2023-01-02 LAB — COMPREHENSIVE METABOLIC PANEL
ALT: 13 IU/L (ref 0–32)
AST: 19 IU/L (ref 0–40)
Albumin/Globulin Ratio: 1.7 (ref 1.2–2.2)
Albumin: 4.2 g/dL (ref 3.8–4.8)
Alkaline Phosphatase: 75 IU/L (ref 44–121)
BUN/Creatinine Ratio: 15 (ref 12–28)
BUN: 13 mg/dL (ref 8–27)
Bilirubin Total: 0.5 mg/dL (ref 0.0–1.2)
CO2: 23 mmol/L (ref 20–29)
Calcium: 9.7 mg/dL (ref 8.7–10.3)
Chloride: 99 mmol/L (ref 96–106)
Creatinine, Ser: 0.86 mg/dL (ref 0.57–1.00)
Globulin, Total: 2.5 g/dL (ref 1.5–4.5)
Glucose: 87 mg/dL (ref 70–99)
Potassium: 4.1 mmol/L (ref 3.5–5.2)
Sodium: 135 mmol/L (ref 134–144)
Total Protein: 6.7 g/dL (ref 6.0–8.5)
eGFR: 71 mL/min/{1.73_m2} (ref >59)

## 2023-01-02 LAB — TSH: TSH: 1.22 u[IU]/mL (ref 0.450–4.500)

## 2023-01-02 LAB — VITAMIN D 25 HYDROXY (VIT D DEFICIENCY, FRACTURES): Vit D, 25-Hydroxy: 30.7 ng/mL (ref 30.0–100.0)

## 2023-01-22 DIAGNOSIS — M9902 Segmental and somatic dysfunction of thoracic region: Secondary | ICD-10-CM | POA: Diagnosis not present

## 2023-01-22 DIAGNOSIS — M25552 Pain in left hip: Secondary | ICD-10-CM | POA: Diagnosis not present

## 2023-01-22 DIAGNOSIS — M9904 Segmental and somatic dysfunction of sacral region: Secondary | ICD-10-CM | POA: Diagnosis not present

## 2023-01-22 DIAGNOSIS — M9903 Segmental and somatic dysfunction of lumbar region: Secondary | ICD-10-CM | POA: Diagnosis not present

## 2023-01-22 DIAGNOSIS — M25512 Pain in left shoulder: Secondary | ICD-10-CM | POA: Diagnosis not present

## 2023-01-22 DIAGNOSIS — M62830 Muscle spasm of back: Secondary | ICD-10-CM | POA: Diagnosis not present

## 2023-01-22 DIAGNOSIS — R293 Abnormal posture: Secondary | ICD-10-CM | POA: Diagnosis not present

## 2023-01-22 DIAGNOSIS — M9901 Segmental and somatic dysfunction of cervical region: Secondary | ICD-10-CM | POA: Diagnosis not present

## 2023-01-30 ENCOUNTER — Ambulatory Visit: Payer: Medicare PPO | Admitting: Internal Medicine

## 2023-01-30 ENCOUNTER — Encounter: Payer: Self-pay | Admitting: Internal Medicine

## 2023-01-30 VITALS — BP 122/70 | HR 76 | Ht 68.0 in | Wt 133.6 lb

## 2023-01-30 DIAGNOSIS — F411 Generalized anxiety disorder: Secondary | ICD-10-CM | POA: Diagnosis not present

## 2023-01-30 DIAGNOSIS — R002 Palpitations: Secondary | ICD-10-CM

## 2023-01-30 MED ORDER — CLONAZEPAM 0.5 MG PO TABS: ORAL_TABLET | ORAL | 0 refills | Status: AC

## 2023-01-30 NOTE — Progress Notes (Signed)
Date:  01/30/2023   Name:  Judy Sampson   DOB:  December 22, 1945   MRN:  [id number]   Chief Complaint: Anxiety  Anxiety Presents for follow-up visit. Symptoms include nervous/anxious behavior and palpitations. Patient reports no chest pain, dizziness or shortness of breath.   Compliance with medications is 76-100% (sertraline 50 mg).  She has had more anxiety over her upcoming trip to [place].  She increased the Sertraline to 50 mg and has noticed some improvement.  She has not tried taking the clonazepam yet. She is still having some palpitations every day but no associated symptoms such as lightheadedness, shortness of breath, chest pain or diaphoresis.  Lab Results  Component Value Date   NA 135 01/01/2023   K 4.1 01/01/2023   CO2 23 01/01/2023   GLUCOSE 87 01/01/2023   BUN 13 01/01/2023   CREATININE 0.86 01/01/2023   CALCIUM 9.7 01/01/2023   EGFR 71 01/01/2023   GFRNONAA 60 11/16/2020   Lab Results  Component Value Date   CHOL 228 (H) 01/01/2023   HDL 79 01/01/2023   LDLCALC 139 (H) 01/01/2023   TRIG 61 01/01/2023   CHOLHDL 2.9 01/01/2023   Lab Results  Component Value Date   TSH 1.220 01/01/2023   No results found for: "HGBA1C" Lab Results  Component Value Date   WBC 4.6 01/01/2023   HGB 13.3 01/01/2023   HCT 40.7 01/01/2023   MCV 89 01/01/2023   PLT 162 01/01/2023   Lab Results  Component Value Date   ALT 13 01/01/2023   AST 19 01/01/2023   ALKPHOS 75 01/01/2023   BILITOT 0.5 01/01/2023   Lab Results  Component Value Date   VD25OH 30.7 01/01/2023     Review of Systems  Constitutional:  Negative for chills and fatigue.  Respiratory:  Negative for chest tightness, shortness of breath and wheezing.   Cardiovascular:  Positive for palpitations. Negative for chest pain.  Neurological:  Negative for dizziness, weakness, light-headedness and headaches.  Psychiatric/Behavioral:  Negative for dysphoric mood and sleep disturbance. The patient is  nervous/anxious.     Patient Active Problem List   Diagnosis Date Noted   Palpitations 01/30/2023   Mild hyperlipidemia 12/22/2020   Generalized anxiety disorder 11/20/2019   Vitamin D deficiency 11/20/2019   Adenomatous polyp of colon 07/03/2017   Muscle spasms of neck 10/04/2016   Cardiac arrhythmia 10/04/2016   Hx of herpes genitalis 05/26/2015   Osteoporosis 05/26/2015   Claustrophobia 05/26/2015   Acid reflux 05/26/2015   Personal history of malignant neoplasm of breast 10/30/1996    Allergies  Allergen Reactions   Scopolamine Nausea And Vomiting    headache   Bisphosphonates     Past Surgical History:  Procedure Laterality Date   FOOT SURGERY Bilateral    MASTECTOMY MODIFIED RADICAL Right 1998   XRT/Chemo + 5 yrs  Aromasin   OOPHORECTOMY Left     Social History   Tobacco Use   Smoking status: Never   Smokeless tobacco: Never   Tobacco comments:    smoking cessation materials not required  Vaping Use   Vaping Use: Never used  Substance Use Topics   Alcohol use: Yes    Alcohol/week: 2.0 standard drinks of alcohol    Types: 2 Standard drinks or equivalent per week    Comment: occasional   Drug use: No     Medication list has been reviewed and updated.  Current Meds  Medication Sig   Calcium-Magnesium-Vitamin D (CALCIUM MAGNESIUM PO) Take  4 tablets by mouth daily.    Omega-3 Fatty Acids (FISH OIL BURP-LESS) 1000 MG CAPS Take by mouth.   sertraline (ZOLOFT) 50 MG tablet TAKE 1 TABLET(50 MG) BY MOUTH DAILY   valACYclovir (VALTREX) 1000 MG tablet Take 1 tablet (1,000 mg total) by mouth 2 (two) times daily as needed.   [DISCONTINUED] clonazePAM (KLONOPIN) 0.5 MG tablet TK 1/2 T PO BID PRF ANXIETY       01/30/2023    2:14 PM 01/01/2023    8:32 AM 12/28/2021    8:44 AM 03/25/2021    1:23 PM  GAD 7 : Generalized Anxiety Score  Nervous, Anxious, on Edge 3 1 0 1  Control/stop worrying 2 0 0 0  Worry too much - different things 2 1 0 0  Trouble relaxing 2 0 0  0  Restless 1 0 0 0  Easily annoyed or irritable 1 0 0 0  Afraid - awful might happen 2 0 0 0  Total GAD 7 Score 13 2 0 1  Anxiety Difficulty Somewhat difficult Not difficult at all Not difficult at all        01/30/2023    2:14 PM 01/01/2023    8:32 AM 12/13/2022    8:31 AM  Depression screen PHQ 2/9  Decreased Interest 0 0 0  Down, Depressed, Hopeless 0 0 0  PHQ - 2 Score 0 0 0  Altered sleeping 0 0 0  Tired, decreased energy 0 1 0  Change in appetite 0 0 0  Feeling bad or failure about yourself  0 0 0  Trouble concentrating 0 0 0  Moving slowly or fidgety/restless 0 0 0  Suicidal thoughts 0 0 0  PHQ-9 Score 0 1 0  Difficult doing work/chores Not difficult at all Not difficult at all Not difficult at all    BP Readings from Last 3 Encounters:  01/30/23 122/70  01/01/23 112/78  12/28/21 118/70    Physical Exam Vitals and nursing note reviewed.  Constitutional:      General: She is not in acute distress.    Appearance: She is well-developed.  HENT:     Head: Normocephalic and atraumatic.  Cardiovascular:     Rate and Rhythm: Normal rate and regular rhythm.     Pulses: Normal pulses.     Heart sounds: No murmur heard. Pulmonary:     Effort: Pulmonary effort is normal. No respiratory distress.     Breath sounds: No wheezing or rhonchi.  Musculoskeletal:     Cervical back: Normal range of motion.     Right lower leg: No edema.     Left lower leg: No edema.  Lymphadenopathy:     Cervical: No cervical adenopathy.  Skin:    General: Skin is warm and dry.     Findings: No rash.  Neurological:     Mental Status: She is alert and oriented to person, place, and time.  Psychiatric:        Mood and Affect: Mood normal.        Behavior: Behavior normal.     Wt Readings from Last 3 Encounters:  01/30/23 133 lb 9.6 oz (60.6 kg)  01/01/23 133 lb (60.3 kg)  12/13/22 128 lb (58.1 kg)    BP 122/70   Pulse 76   Ht 5\' 8"  (1.727 m)   Wt 133 lb 9.6 oz (60.6 kg)   SpO2  97%   BMI 20.31 kg/m   Assessment and Plan:  Problem List Items Addressed  This Visit       Other   Generalized anxiety disorder - Primary    On Sertraline 50 mg daily now - would not increase further at this time Encouraged to use clonazepam PRN anxiety or panic attacks and prior to air travel      Palpitations    she is reassured - these are benign and not likely to progress She should avoid stimulants, caffeine, etc would not treat symptoms with beta blocker at this time due to borderline low BP. Follow up if needed       Return if symptoms worsen or fail to improve.   Partially dictated using [place], any errors are not intentional.  Glean Hess, MD [place], [place]

## 2023-01-30 NOTE — Assessment & Plan Note (Signed)
she is reassured - these are benign and not likely to progress She should avoid stimulants, caffeine, etc would not treat symptoms with beta blocker at this time due to borderline low BP. Follow up if needed

## 2023-01-30 NOTE — Assessment & Plan Note (Addendum)
On Sertraline 50 mg daily now - would not increase further at this time Encouraged to use clonazepam PRN anxiety or panic attacks and prior to air travel

## 2023-02-16 ENCOUNTER — Encounter: Payer: Self-pay | Admitting: Internal Medicine

## 2023-02-16 ENCOUNTER — Telehealth: Payer: Medicare PPO | Admitting: Internal Medicine

## 2023-02-16 DIAGNOSIS — F411 Generalized anxiety disorder: Secondary | ICD-10-CM | POA: Diagnosis not present

## 2023-02-16 MED ORDER — SERTRALINE HCL 50 MG PO TABS: ORAL_TABLET | ORAL | 0 refills | Status: DC

## 2023-02-16 NOTE — Progress Notes (Signed)
Date:  02/16/2023   Name:  Judy Sampson   DOB:  [birth year]   MRN:  [id number]  This encounter was conducted via video encounter. This platform was deemed appropriate for the issues to be addressed.  The patient was correctly identified.  I advised that I am conducting the visit from a secure room in my office at [facility] clinic.  The patient is located in [place] in her hotel room. The limitations of this form of encounter were discussed with the patient and he/she agreed to proceed.  Some vital signs will be absent.  Chief Complaint: Anxiety (Wants to discuss clonazepam. She is having to take it daily and wants to know if she should be taking something different.)  Anxiety Presents for follow-up visit. Symptoms include nervous/anxious behavior. Patient reports no chest pain, nausea, palpitations or shortness of breath. Symptoms occur most days.    She is much more anxious.  She went to [place] to see her pregnant daughter who was admitted with eclampsia and will be there until she is induced.  She took the clonazepam and it helped her on the flight.  She is very anxious about the situation. She plans to stay until the baby is born then return home.  She needs advice on how often she can take clonazepam safely.  Lab Results  Component Value Date   NA 135 01/01/2023   K 4.1 01/01/2023   CO2 23 01/01/2023   GLUCOSE 87 01/01/2023   BUN 13 01/01/2023   CREATININE 0.86 01/01/2023   CALCIUM 9.7 01/01/2023   EGFR 71 01/01/2023   GFRNONAA 60 11/16/2020   Lab Results  Component Value Date   CHOL 228 (H) 01/01/2023   HDL 79 01/01/2023   LDLCALC 139 (H) 01/01/2023   TRIG 61 01/01/2023   CHOLHDL 2.9 01/01/2023   Lab Results  Component Value Date   TSH 1.220 01/01/2023   No results found for: "HGBA1C" Lab Results  Component Value Date   WBC 4.6 01/01/2023   HGB 13.3 01/01/2023   HCT 40.7 01/01/2023   MCV 89 01/01/2023   PLT 162 01/01/2023   Lab Results  Component Value Date    ALT 13 01/01/2023   AST 19 01/01/2023   ALKPHOS 75 01/01/2023   BILITOT 0.5 01/01/2023   Lab Results  Component Value Date   VD25OH 30.7 01/01/2023     Review of Systems  Respiratory:  Negative for shortness of breath.   Cardiovascular:  Negative for chest pain and palpitations.  Gastrointestinal:  Negative for nausea and vomiting.  Neurological:  Negative for headaches.  Psychiatric/Behavioral:  The patient is nervous/anxious.     Patient Active Problem List   Diagnosis Date Noted   Palpitations 01/30/2023   Mild hyperlipidemia 12/22/2020   Generalized anxiety disorder 11/20/2019   Vitamin D deficiency 11/20/2019   Adenomatous polyp of colon 07/03/2017   Muscle spasms of neck 10/04/2016   Cardiac arrhythmia 10/04/2016   Hx of herpes genitalis 05/26/2015   Osteoporosis 05/26/2015   Claustrophobia 05/26/2015   Acid reflux 05/26/2015   Personal history of malignant neoplasm of breast 10/30/1996    Allergies  Allergen Reactions   Scopolamine Nausea And Vomiting    headache   Bisphosphonates     Past Surgical History:  Procedure Laterality Date   FOOT SURGERY Bilateral    MASTECTOMY MODIFIED RADICAL Right 1998   XRT/Chemo + 5 yrs  Aromasin   OOPHORECTOMY Left     Social History  Tobacco Use   Smoking status: Never   Smokeless tobacco: Never   Tobacco comments:    smoking cessation materials not required  Vaping Use   Vaping Use: Never used  Substance Use Topics   Alcohol use: Yes    Alcohol/week: 2.0 standard drinks of alcohol    Types: 2 Standard drinks or equivalent per week    Comment: occasional   Drug use: No     Medication list has been reviewed and updated.  Current Meds  Medication Sig   Calcium-Magnesium-Vitamin D (CALCIUM MAGNESIUM PO) Take 4 tablets by mouth daily.    clonazePAM (KLONOPIN) 0.5 MG tablet TK 1/2 T PO BID PRF ANXIETY   Omega-3 Fatty Acids (FISH OIL BURP-LESS) 1000 MG CAPS Take by mouth.   valACYclovir (VALTREX) 1000 MG  tablet Take 1 tablet (1,000 mg total) by mouth 2 (two) times daily as needed.   [DISCONTINUED] sertraline (ZOLOFT) 50 MG tablet TAKE 1 TABLET(50 MG) BY MOUTH DAILY       02/16/2023    2:12 PM 01/30/2023    2:14 PM 01/01/2023    8:32 AM 12/28/2021    8:44 AM  GAD 7 : Generalized Anxiety Score  Nervous, Anxious, on Edge 0  Control/stop worrying 3 2 0 0  Worry too much - different things 0  Trouble relaxing 3 2 0 0  Restless 0 1 0 0  Easily annoyed or irritable 0 1 0 0  Afraid - awful might happen 3 2 0 0  Total GAD 7 Score 0  Anxiety Difficulty Extremely difficult Somewhat difficult Not difficult at all Not difficult at all       02/16/2023    2:12 PM 01/30/2023    2:14 PM 01/01/2023    8:32 AM  Depression screen PHQ 2/9  Decreased Interest 0 0 0  Down, Depressed, Hopeless 0 0 0  PHQ - 2 Score 0 0 0  Altered sleeping 0 0 0  Tired, decreased energy 0 0 1  Change in appetite 0 0 0  Feeling bad or failure about yourself  0 0 0  Trouble concentrating 0 0 0  Moving slowly or fidgety/restless 0 0 0  Suicidal thoughts 0 0 0  PHQ-9 Score 0 0 1  Difficult doing work/chores Not difficult at all Not difficult at all Not difficult at all    BP Readings from Last 3 Encounters:  01/30/23 122/70  01/01/23 112/78  12/28/21 118/70    Physical Exam Constitutional:      Appearance: Normal appearance.  Pulmonary:     Effort: Pulmonary effort is normal.  Neurological:     General: No focal deficit present.     Mental Status: She is alert.  Psychiatric:        Attention and Perception: Attention normal.        Mood and Affect: Mood normal.        Speech: Speech normal.        Behavior: Behavior normal.        Cognition and Memory: Cognition normal.     Wt Readings from Last 3 Encounters:  02/16/23 133 lb 9.6 oz (60.6 kg)  01/30/23 133 lb 9.6 oz (60.6 kg)  01/01/23 133 lb (60.3 kg)    Ht  (1.727 m)   Wt 133 lb 9.6 oz (60.6 kg)   BMI 20.31 kg/m    Assessment and Plan:  Problem List Items Addressed This Visit  Other   Generalized anxiety disorder    Having more anxiety regarding her daughter and unborn baby She can take 1/4 of a 0.5 clonazepam up to twice a day if needed Continue Sertraline - refill sent       Relevant Medications   sertraline (ZOLOFT) 50 MG tablet    No follow-ups on file.  I spent 12 minutes on this encounter, 100% of the time via video.  Partially dictated using Dragon software, any errors are not intentional.  Reubin Milan, MD [facility] Medicine [place], [place]

## 2023-02-16 NOTE — Assessment & Plan Note (Signed)
Having more anxiety regarding her daughter and unborn baby She can take 1/4 of a 0.5 clonazepam up to twice a day if needed Continue Sertraline - refill sent

## 2023-04-10 DIAGNOSIS — M9902 Segmental and somatic dysfunction of thoracic region: Secondary | ICD-10-CM | POA: Diagnosis not present

## 2023-04-10 DIAGNOSIS — M60812 Other myositis, left shoulder: Secondary | ICD-10-CM | POA: Diagnosis not present

## 2023-04-10 DIAGNOSIS — M9904 Segmental and somatic dysfunction of sacral region: Secondary | ICD-10-CM | POA: Diagnosis not present

## 2023-04-10 DIAGNOSIS — M9903 Segmental and somatic dysfunction of lumbar region: Secondary | ICD-10-CM | POA: Diagnosis not present

## 2023-04-10 DIAGNOSIS — M25512 Pain in left shoulder: Secondary | ICD-10-CM | POA: Diagnosis not present

## 2023-04-10 DIAGNOSIS — M25552 Pain in left hip: Secondary | ICD-10-CM | POA: Diagnosis not present

## 2023-04-10 DIAGNOSIS — M62830 Muscle spasm of back: Secondary | ICD-10-CM | POA: Diagnosis not present

## 2023-04-10 DIAGNOSIS — M9901 Segmental and somatic dysfunction of cervical region: Secondary | ICD-10-CM | POA: Diagnosis not present

## 2023-04-25 DIAGNOSIS — M62830 Muscle spasm of back: Secondary | ICD-10-CM | POA: Diagnosis not present

## 2023-04-25 DIAGNOSIS — M25511 Pain in right shoulder: Secondary | ICD-10-CM | POA: Diagnosis not present

## 2023-04-25 DIAGNOSIS — M9904 Segmental and somatic dysfunction of sacral region: Secondary | ICD-10-CM | POA: Diagnosis not present

## 2023-04-25 DIAGNOSIS — M9901 Segmental and somatic dysfunction of cervical region: Secondary | ICD-10-CM | POA: Diagnosis not present

## 2023-04-25 DIAGNOSIS — M9902 Segmental and somatic dysfunction of thoracic region: Secondary | ICD-10-CM | POA: Diagnosis not present

## 2023-05-02 DIAGNOSIS — D2272 Melanocytic nevi of left lower limb, including hip: Secondary | ICD-10-CM | POA: Diagnosis not present

## 2023-05-02 DIAGNOSIS — L218 Other seborrheic dermatitis: Secondary | ICD-10-CM | POA: Diagnosis not present

## 2023-05-02 DIAGNOSIS — L570 Actinic keratosis: Secondary | ICD-10-CM | POA: Diagnosis not present

## 2023-05-02 DIAGNOSIS — S80861A Insect bite (nonvenomous), right lower leg, initial encounter: Secondary | ICD-10-CM | POA: Diagnosis not present

## 2023-05-02 DIAGNOSIS — D485 Neoplasm of uncertain behavior of skin: Secondary | ICD-10-CM | POA: Diagnosis not present

## 2023-05-02 DIAGNOSIS — D2271 Melanocytic nevi of right lower limb, including hip: Secondary | ICD-10-CM | POA: Diagnosis not present

## 2023-05-02 DIAGNOSIS — D2262 Melanocytic nevi of left upper limb, including shoulder: Secondary | ICD-10-CM | POA: Diagnosis not present

## 2023-05-02 DIAGNOSIS — L821 Other seborrheic keratosis: Secondary | ICD-10-CM | POA: Diagnosis not present

## 2023-05-02 DIAGNOSIS — S80862A Insect bite (nonvenomous), left lower leg, initial encounter: Secondary | ICD-10-CM | POA: Diagnosis not present

## 2023-05-04 ENCOUNTER — Other Ambulatory Visit: Payer: Self-pay | Admitting: Internal Medicine

## 2023-05-04 DIAGNOSIS — B009 Herpesviral infection, unspecified: Secondary | ICD-10-CM

## 2023-05-04 MED ORDER — VALACYCLOVIR HCL 1 G PO TABS: 1000.0000 mg | ORAL_TABLET | Freq: Two times a day (BID) | ORAL | 2 refills | Status: AC | PRN

## 2023-05-04 NOTE — Telephone Encounter (Signed)
Medication Refill - Medication: valACYclovir (VALTREX) 1000 MG tablet [[id number]]   Has the patient contacted their pharmacy? Yes.   (Agent: If no, request that the patient contact the pharmacy for the refill. If patient does not wish to contact the pharmacy document the reason why and proceed with request.) (Agent: If yes, when and what did the pharmacy advise?)  Preferred Pharmacy (with phone number or street name):  [facility] - [place], [place] - [address] Phone: [phone]  Fax: [phone]     Has the patient been seen for an appointment in the last year OR does the patient have an upcoming appointment? Yes.    Agent: Please be advised that RX refills may take up to 3 business days. We ask that you follow-up with your pharmacy.

## 2023-05-04 NOTE — Telephone Encounter (Signed)
Requested medication (s) are due for refill today: yes  Requested medication (s) are on the active medication list: yes  Last refill:  07/01/2021  Future visit scheduled: no  Notes to clinic:  .ok for refill? Not been since 2022.      Requested Prescriptions  Pending Prescriptions Disp Refills   valACYclovir (VALTREX) 1000 MG tablet 30 tablet 2    Sig: Take 1 tablet (1,000 mg total) by mouth 2 (two) times daily as needed.     Antimicrobials:  Antiviral Agents - Anti-Herpetic Passed - 05/04/2023 11:06 AM      Passed - Valid encounter within last 12 months    Recent Outpatient Visits           2 months ago Generalized anxiety disorder   [place] [facility] at [facility], Nyoka Cowden, MD   3 months ago Generalized anxiety disorder   [facility] at [facility], Nyoka Cowden, MD   4 months ago Annual physical exam   [facility] at [facility], Nyoka Cowden, MD   1 year ago Annual physical exam   [facility] Medicine at [facility], Nyoka Cowden, MD   2 years ago Drug-induced headache, not elsewhere classified, not intractable   [facility] at [facility], Nyoka Cowden, MD

## 2023-06-04 DIAGNOSIS — M25551 Pain in right hip: Secondary | ICD-10-CM | POA: Diagnosis not present

## 2023-06-04 DIAGNOSIS — M60851 Other myositis, right thigh: Secondary | ICD-10-CM | POA: Diagnosis not present

## 2023-06-04 DIAGNOSIS — M25512 Pain in left shoulder: Secondary | ICD-10-CM | POA: Diagnosis not present

## 2023-06-04 DIAGNOSIS — M62830 Muscle spasm of back: Secondary | ICD-10-CM | POA: Diagnosis not present

## 2023-06-04 DIAGNOSIS — M9901 Segmental and somatic dysfunction of cervical region: Secondary | ICD-10-CM | POA: Diagnosis not present

## 2023-06-04 DIAGNOSIS — M9903 Segmental and somatic dysfunction of lumbar region: Secondary | ICD-10-CM | POA: Diagnosis not present

## 2023-06-04 DIAGNOSIS — M9904 Segmental and somatic dysfunction of sacral region: Secondary | ICD-10-CM | POA: Diagnosis not present

## 2023-07-18 DIAGNOSIS — M25511 Pain in right shoulder: Secondary | ICD-10-CM | POA: Diagnosis not present

## 2023-07-18 DIAGNOSIS — H9201 Otalgia, right ear: Secondary | ICD-10-CM | POA: Diagnosis not present

## 2023-07-18 DIAGNOSIS — M60811 Other myositis, right shoulder: Secondary | ICD-10-CM | POA: Diagnosis not present

## 2023-07-18 DIAGNOSIS — M62830 Muscle spasm of back: Secondary | ICD-10-CM | POA: Diagnosis not present

## 2023-07-18 DIAGNOSIS — M9902 Segmental and somatic dysfunction of thoracic region: Secondary | ICD-10-CM | POA: Diagnosis not present

## 2023-07-18 DIAGNOSIS — M9904 Segmental and somatic dysfunction of sacral region: Secondary | ICD-10-CM | POA: Diagnosis not present

## 2023-07-18 DIAGNOSIS — M530 Cervicocranial syndrome: Secondary | ICD-10-CM | POA: Diagnosis not present

## 2023-07-18 DIAGNOSIS — M9901 Segmental and somatic dysfunction of cervical region: Secondary | ICD-10-CM | POA: Diagnosis not present

## 2023-07-21 DIAGNOSIS — H9201 Otalgia, right ear: Secondary | ICD-10-CM | POA: Diagnosis not present

## 2023-08-01 DIAGNOSIS — H16223 Keratoconjunctivitis sicca, not specified as Sjogren's, bilateral: Secondary | ICD-10-CM | POA: Diagnosis not present

## 2023-08-01 DIAGNOSIS — H25813 Combined forms of age-related cataract, bilateral: Secondary | ICD-10-CM | POA: Diagnosis not present

## 2023-08-10 DIAGNOSIS — L03011 Cellulitis of right finger: Secondary | ICD-10-CM | POA: Diagnosis not present

## 2023-09-18 DIAGNOSIS — M9904 Segmental and somatic dysfunction of sacral region: Secondary | ICD-10-CM | POA: Diagnosis not present

## 2023-09-18 DIAGNOSIS — M25512 Pain in left shoulder: Secondary | ICD-10-CM | POA: Diagnosis not present

## 2023-09-18 DIAGNOSIS — M9902 Segmental and somatic dysfunction of thoracic region: Secondary | ICD-10-CM | POA: Diagnosis not present

## 2023-09-18 DIAGNOSIS — M9901 Segmental and somatic dysfunction of cervical region: Secondary | ICD-10-CM | POA: Diagnosis not present

## 2023-09-18 DIAGNOSIS — M62830 Muscle spasm of back: Secondary | ICD-10-CM | POA: Diagnosis not present

## 2023-10-16 DIAGNOSIS — M60862 Other myositis, left lower leg: Secondary | ICD-10-CM | POA: Diagnosis not present

## 2023-10-16 DIAGNOSIS — M9902 Segmental and somatic dysfunction of thoracic region: Secondary | ICD-10-CM | POA: Diagnosis not present

## 2023-10-16 DIAGNOSIS — M9901 Segmental and somatic dysfunction of cervical region: Secondary | ICD-10-CM | POA: Diagnosis not present

## 2023-10-16 DIAGNOSIS — M25552 Pain in left hip: Secondary | ICD-10-CM | POA: Diagnosis not present

## 2023-10-16 DIAGNOSIS — M62830 Muscle spasm of back: Secondary | ICD-10-CM | POA: Diagnosis not present

## 2023-10-16 DIAGNOSIS — M9904 Segmental and somatic dysfunction of sacral region: Secondary | ICD-10-CM | POA: Diagnosis not present

## 2023-11-05 ENCOUNTER — Other Ambulatory Visit: Payer: Self-pay | Admitting: Internal Medicine

## 2023-11-05 DIAGNOSIS — F411 Generalized anxiety disorder: Secondary | ICD-10-CM

## 2023-11-05 NOTE — Telephone Encounter (Signed)
 Medication Refill -  Most Recent Primary Care Visit:  Provider: BERGLUND, LAURA H  Department: [facility]  Visit Type: MYCHART VIDEO VISIT  Date: 02/16/2023  Medication: sertraline  (ZOLOFT ) 50 MG tablet  Has the patient contacted their pharmacy? No Had no available refills at pharmacy, decided to call office.   Is this the correct pharmacy for this prescription? Yes This is the patient's preferred pharmacy:  [facility] - [place], [place] - [address] [place] [address] [place] [address] Phone: [phone] Fax: [phone]  Has the prescription been filled recently? No  Is the patient out of the medication? No  Has the patient been seen for an appointment in the last year OR does the patient have an upcoming appointment? Yes  Can we respond through MyChart? Yes  Agent: Please be advised that Rx refills may take up to 3 business days. We ask that you follow-up with your pharmacy.

## 2023-11-07 NOTE — Telephone Encounter (Signed)
 Requested medication (s) are due for refill today - unsure  Requested medication (s) are on the active medication list -yes  Future visit scheduled -yes  Last refill: 02/16/23 #90  Notes to clinic: Attempted to call patient to schedule follow up appointment- she has not been seen since April for this- left message to call office- do you want to refill?  Requested Prescriptions  Pending Prescriptions Disp Refills   sertraline  (ZOLOFT ) 50 MG tablet 90 tablet 0    Sig: TAKE 1 TABLET(50 MG) BY MOUTH DAILY     Psychiatry:  Antidepressants - SSRI - sertraline  Failed - 11/07/2023  2:22 PM      Failed - Valid encounter within last 6 months    Recent Outpatient Visits           8 months ago Generalized anxiety disorder   [place] [facility] at [facility], Leita DEL, MD   9 months ago Generalized anxiety disorder   [facility] at [facility], Leita DEL, MD   10 months ago Annual physical exam   [facility] at [facility], Leita DEL, MD   1 year ago Annual physical exam   [place] [facility] at [facility], Leita DEL, MD   2 years ago Drug-induced headache, not elsewhere classified, not intractable   [place] [facility] at [facility], Leita DEL, MD       Future Appointments             In 4 months Justus Leita DEL, MD [facility] Medicine at [facility], [facility]            Passed - AST in normal range and within 360 days    AST  Date Value Ref Range Status  01/01/2023 19 0 - 40 IU/L Final         Passed - ALT in normal range and within 360 days    ALT  Date Value Ref Range Status  01/01/2023 13 0 - 32 IU/L Final         Passed - Completed PHQ-2 or PHQ-9 in the last 360 days         Requested Prescriptions  Pending Prescriptions Disp Refills   sertraline   (ZOLOFT ) 50 MG tablet 90 tablet 0    Sig: TAKE 1 TABLET(50 MG) BY MOUTH DAILY     Psychiatry:  Antidepressants - SSRI - sertraline  Failed - 11/07/2023  2:22 PM      Failed - Valid encounter within last 6 months    Recent Outpatient Visits           8 months ago Generalized anxiety disorder   [place] [facility] at [facility], Leita DEL, MD   9 months ago Generalized anxiety disorder   [facility] at [facility], Leita DEL, MD   10 months ago Annual physical exam   [facility] Primary Care & Sports Medicine at [facility], Leita DEL, MD   1 year ago Annual physical exam   [facility] at [facility], Leita DEL, MD   2 years ago Drug-induced headache, not elsewhere classified, not intractable   [facility] & [facility] Medicine at [facility], Leita DEL, MD  Future Appointments             In 4 months Justus Leita DEL, MD [facility] Care [facility] Medicine at [facility], [facility]            Passed - AST in normal range and within 360 days    AST  Date Value Ref Range Status  01/01/2023 19 0 - 40 IU/L Final         Passed - ALT in normal range and within 360 days    ALT  Date Value Ref Range Status  01/01/2023 13 0 - 32 IU/L Final         Passed - Completed PHQ-2 or PHQ-9 in the last 360 days

## 2023-11-12 ENCOUNTER — Encounter: Payer: Self-pay | Admitting: Internal Medicine

## 2023-11-12 ENCOUNTER — Ambulatory Visit: Payer: Medicare PPO | Admitting: Internal Medicine

## 2023-11-12 VITALS — BP 118/62 | HR 72 | Ht 68.0 in | Wt 128.0 lb

## 2023-11-12 DIAGNOSIS — F411 Generalized anxiety disorder: Secondary | ICD-10-CM

## 2023-11-12 DIAGNOSIS — J069 Acute upper respiratory infection, unspecified: Secondary | ICD-10-CM

## 2023-11-12 DIAGNOSIS — Z170 Estrogen receptor positive status [ER+]: Secondary | ICD-10-CM | POA: Diagnosis not present

## 2023-11-12 DIAGNOSIS — C50912 Malignant neoplasm of unspecified site of left female breast: Secondary | ICD-10-CM | POA: Diagnosis not present

## 2023-11-12 DIAGNOSIS — Z780 Asymptomatic menopausal state: Secondary | ICD-10-CM | POA: Diagnosis not present

## 2023-11-12 DIAGNOSIS — M8580 Other specified disorders of bone density and structure, unspecified site: Secondary | ICD-10-CM | POA: Diagnosis not present

## 2023-11-12 MED ORDER — SERTRALINE HCL 50 MG PO TABS: ORAL_TABLET | ORAL | 1 refills | Status: DC

## 2023-11-12 NOTE — Progress Notes (Signed)
 Date:  11/12/2023   Name:  Laprecious Austill   DOB:  03-18-1946   MRN:  [id number]   Chief Complaint: Anxiety  Anxiety Presents for follow-up visit. Symptoms include nervous/anxious behavior. Patient reports no chest pain, dizziness, palpitations or shortness of breath. Symptoms occur rarely. The quality of sleep is good.   Compliance with medications is 76-100%.    Review of Systems  Constitutional:  Negative for chills and fatigue.  Respiratory:  Negative for chest tightness and shortness of breath.   Cardiovascular:  Negative for chest pain and palpitations.  Neurological:  Negative for dizziness, light-headedness and headaches.  Psychiatric/Behavioral:  Negative for dysphoric mood and sleep disturbance. The patient is nervous/anxious.      Lab Results  Component Value Date   NA 135 01/01/2023   K 4.1 01/01/2023   CO2 23 01/01/2023   GLUCOSE 87 01/01/2023   BUN 13 01/01/2023   CREATININE 0.86 01/01/2023   CALCIUM 9.7 01/01/2023   EGFR 71 01/01/2023   GFRNONAA 60 11/16/2020   Lab Results  Component Value Date   CHOL 228 (H) 01/01/2023   HDL 79 01/01/2023   LDLCALC 139 (H) 01/01/2023   TRIG 61 01/01/2023   CHOLHDL 2.9 01/01/2023   Lab Results  Component Value Date   TSH 1.220 01/01/2023   No results found for: HGBA1C Lab Results  Component Value Date   WBC 4.6 01/01/2023   HGB 13.3 01/01/2023   HCT 40.7 01/01/2023   MCV 89 01/01/2023   PLT 162 01/01/2023   Lab Results  Component Value Date   ALT 13 01/01/2023   AST 19 01/01/2023   ALKPHOS 75 01/01/2023   BILITOT 0.5 01/01/2023   Lab Results  Component Value Date   VD25OH 30.7 01/01/2023     Patient Active Problem List   Diagnosis Date Noted   Palpitations 01/30/2023   Mild hyperlipidemia 12/22/2020   Generalized anxiety disorder 11/20/2019   Vitamin D  deficiency 11/20/2019   Adenomatous polyp of colon 07/03/2017   Muscle spasms of neck 10/04/2016   Cardiac arrhythmia 10/04/2016   Hx of  herpes genitalis 05/26/2015   Osteoporosis 05/26/2015   Claustrophobia 05/26/2015   Acid reflux 05/26/2015   Personal history of malignant neoplasm of breast 10/30/1996    Allergies  Allergen Reactions   Scopolamine  Nausea And Vomiting    headache   Bisphosphonates     Past Surgical History:  Procedure Laterality Date   FOOT SURGERY Bilateral    MASTECTOMY MODIFIED RADICAL Right 1998   XRT/Chemo + 5 yrs  Aromasin   OOPHORECTOMY Left     Social History   Tobacco Use   Smoking status: Never   Smokeless tobacco: Never   Tobacco comments:    smoking cessation materials not required  Vaping Use   Vaping status: Never Used  Substance Use Topics   Alcohol use: Yes    Alcohol/week: 2.0 standard drinks of alcohol    Types: 2 Standard drinks or equivalent per week    Comment: occasional   Drug use: No     Medication list has been reviewed and updated.  Current Meds  Medication Sig   Calcium-Magnesium-Vitamin D  (CALCIUM MAGNESIUM PO) Take 4 tablets by mouth daily.    clonazePAM  (KLONOPIN ) 0.5 MG tablet TK 1/2 T PO BID PRF ANXIETY   Omega-3 Fatty Acids (FISH OIL BURP-LESS) 1000 MG CAPS Take by mouth.   RESTASIS 0.05 % ophthalmic emulsion 1 drop 2 (two) times daily.   valACYclovir  (VALTREX )  1000 MG tablet Take 1 tablet (1,000 mg total) by mouth 2 (two) times daily as needed.   [DISCONTINUED] sertraline  (ZOLOFT ) 50 MG tablet TAKE 1 TABLET(50 MG) BY MOUTH DAILY       11/12/2023    1:35 PM 02/16/2023    2:12 PM 01/30/2023    2:14 PM 01/01/2023    8:32 AM  GAD 7 : Generalized Anxiety Score  Nervous, Anxious, on Edge 0 3 3 1   Control/stop worrying 0 3 2 0  Worry too much - different things 0 3 2 1   Trouble relaxing 0 3 2 0  Restless 0 0 1 0  Easily annoyed or irritable 0 0 1 0  Afraid - awful might happen 0 3 2 0  Total GAD 7 Score 0 15 13 2   Anxiety Difficulty Not difficult at all Extremely difficult Somewhat difficult Not difficult at all       11/12/2023    1:35 PM  02/16/2023    2:12 PM 01/30/2023    2:14 PM  Depression screen PHQ 2/9  Decreased Interest 0 0 0  Down, Depressed, Hopeless 0 0 0  PHQ - 2 Score 0 0 0  Altered sleeping 0 0 0  Tired, decreased energy 3 0 0  Change in appetite 0 0 0  Feeling bad or failure about yourself  0 0 0  Trouble concentrating 0 0 0  Moving slowly or fidgety/restless 0 0 0  Suicidal thoughts 0 0 0  PHQ-9 Score 3 0 0  Difficult doing work/chores Not difficult at all Not difficult at all Not difficult at all    BP Readings from Last 3 Encounters:  11/12/23 118/62  01/30/23 122/70  01/01/23 112/78    Physical Exam Vitals and nursing note reviewed.  Constitutional:      General: She is not in acute distress.    Appearance: Normal appearance. She is well-developed.  HENT:     Head: Normocephalic and atraumatic.  Cardiovascular:     Rate and Rhythm: Normal rate and regular rhythm.     Heart sounds: No murmur heard. Pulmonary:     Effort: Pulmonary effort is normal. No respiratory distress.     Breath sounds: Normal breath sounds. No decreased breath sounds, wheezing or rhonchi.  Musculoskeletal:     Cervical back: Normal range of motion.  Lymphadenopathy:     Cervical: No cervical adenopathy.  Skin:    General: Skin is warm and dry.     Findings: No rash.  Neurological:     Mental Status: She is alert and oriented to person, place, and time.  Psychiatric:        Mood and Affect: Mood normal.        Behavior: Behavior normal.     Wt Readings from Last 3 Encounters:  02/16/23 133 lb 9.6 oz (60.6 kg)  01/30/23 133 lb 9.6 oz (60.6 kg)  01/01/23 133 lb (60.3 kg)    BP 118/62   Pulse 72   Ht 5' 8 (1.727 m)   SpO2 97%   BMI 20.31 kg/m   Assessment and Plan:  Problem List Items Addressed This Visit       Unprioritized   Generalized anxiety disorder - Primary   Clinically stable Sertraline  with good response, No SI or HI reported. She uses low dose clonazepam  for anxiety related to air  travel. No change in management at this time.       Relevant Medications   sertraline  (ZOLOFT ) 50 MG  tablet   Other Visit Diagnoses       Viral URI       resolving       No follow-ups on file.    Leita HILARIO Adie, MD [facility] [place]

## 2023-11-12 NOTE — Assessment & Plan Note (Addendum)
 Clinically stable Sertraline with good response, No SI or HI reported. She uses low dose clonazepam for anxiety related to air travel. No change in management at this time.

## 2023-11-20 DIAGNOSIS — Z780 Asymptomatic menopausal state: Secondary | ICD-10-CM | POA: Diagnosis not present

## 2023-11-20 DIAGNOSIS — Z853 Personal history of malignant neoplasm of breast: Secondary | ICD-10-CM | POA: Diagnosis not present

## 2023-11-20 DIAGNOSIS — R92333 Mammographic heterogeneous density, bilateral breasts: Secondary | ICD-10-CM | POA: Diagnosis not present

## 2023-11-20 DIAGNOSIS — M8588 Other specified disorders of bone density and structure, other site: Secondary | ICD-10-CM | POA: Diagnosis not present

## 2023-11-20 LAB — HM DEXA SCAN

## 2023-11-20 LAB — HM MAMMOGRAPHY

## 2023-11-23 DIAGNOSIS — Z853 Personal history of malignant neoplasm of breast: Secondary | ICD-10-CM | POA: Diagnosis not present

## 2023-11-23 DIAGNOSIS — Z08 Encounter for follow-up examination after completed treatment for malignant neoplasm: Secondary | ICD-10-CM | POA: Diagnosis not present

## 2023-11-30 DIAGNOSIS — M8589 Other specified disorders of bone density and structure, multiple sites: Secondary | ICD-10-CM | POA: Diagnosis not present

## 2023-12-05 DIAGNOSIS — D485 Neoplasm of uncertain behavior of skin: Secondary | ICD-10-CM | POA: Diagnosis not present

## 2023-12-05 DIAGNOSIS — L570 Actinic keratosis: Secondary | ICD-10-CM | POA: Diagnosis not present

## 2023-12-05 DIAGNOSIS — Z7189 Other specified counseling: Secondary | ICD-10-CM | POA: Diagnosis not present

## 2023-12-05 DIAGNOSIS — L218 Other seborrheic dermatitis: Secondary | ICD-10-CM | POA: Diagnosis not present

## 2023-12-05 DIAGNOSIS — D2262 Melanocytic nevi of left upper limb, including shoulder: Secondary | ICD-10-CM | POA: Diagnosis not present

## 2023-12-05 DIAGNOSIS — L738 Other specified follicular disorders: Secondary | ICD-10-CM | POA: Diagnosis not present

## 2023-12-19 ENCOUNTER — Ambulatory Visit (INDEPENDENT_AMBULATORY_CARE_PROVIDER_SITE_OTHER): Payer: Medicare PPO

## 2023-12-19 DIAGNOSIS — Z0000 Encounter for general adult medical examination without abnormal findings: Secondary | ICD-10-CM

## 2023-12-19 NOTE — Patient Instructions (Addendum)
 Ms. Lingle , Thank you for taking time to come for your Medicare Wellness Visit. I appreciate your ongoing commitment to your health goals. Please review the following plan we discussed and let me know if I can assist you in the future.   Referrals/Orders/Follow-Ups/Clinician Recommendations: NONE  This is a list of the screening recommended for you and due dates:  Health Maintenance  Topic Date Due   Colon Cancer Screening  12/24/2022   COVID-19 Vaccine (8 - 2024-25 season) 10/24/2023   Mammogram  11/14/2023   Medicare Annual Wellness Visit  12/18/2024   DTaP/Tdap/Td vaccine (3 - Td or Tdap) 10/29/2028   Pneumonia Vaccine  Completed   Flu Shot  Completed   DEXA scan (bone density measurement)  Completed   Hepatitis C Screening  Completed   Zoster (Shingles) Vaccine  Completed   HPV Vaccine  Aged Out    Advanced directives: (ACP Link)Information on Advanced Care Planning can be found at [facility] Advance Health Care Directives Advance Health Care Directives ([email or web address]   Next Medicare Annual Wellness Visit scheduled for next year: Yes   12/24/24 @ 8:10 AM BY PHONE

## 2023-12-19 NOTE — Progress Notes (Signed)
 Subjective:   Judy Sampson is a 78 y.o. female who presents for Medicare Annual (Subsequent) preventive examination.  Visit Complete: Virtual I connected with  Judy Sampson on 12/19/23 by a audio enabled telemedicine application and verified that I am speaking with the correct person using two identifiers.  This patient declined Interactive audio and Acupuncturist. Therefore the visit was completed with audio only.   Patient Location: Home  Provider Location: Office/Clinic  I discussed the limitations of evaluation and management by telemedicine. The patient expressed understanding and agreed to proceed.  Vital Signs: Because this visit was a virtual/telehealth visit, some criteria may be missing or patient reported. Any vitals not documented were not able to be obtained and vitals that have been documented are patient reported.  Cardiac Risk Factors include: advanced age (>21men, >54 women);dyslipidemia     Objective:    Today's Vitals   12/19/23 0813  PainSc: 0-No pain   There is no height or weight on file to calculate BMI.     12/19/2023    8:18 AM 12/13/2022    8:32 AM 12/07/2021    8:37 AM 12/06/2020    8:59 AM 12/03/2019    8:55 AM 11/27/2018    9:08 AM 11/21/2017    8:59 AM  Advanced Directives  Does Patient Have a Medical Advance Directive? Yes Yes Yes Yes Yes Yes No  Type of Estate agent of [place];Living will Healthcare Power of [place];Living will Healthcare Power of [place];Living will Healthcare Power of [place];Living will Healthcare Power of [place];Living will Living will;Healthcare Power of Attorney   Does patient want to make changes to medical advance directive? No - Patient declined No - Patient declined       Copy of Healthcare Power of Attorney in Chart? Yes - validated most recent copy scanned in chart (See row information) Yes - validated most recent copy scanned in chart (See row information) Yes - validated most  recent copy scanned in chart (See row information) Yes - validated most recent copy scanned in chart (See row information) Yes - validated most recent copy scanned in chart (See row information) Yes - validated most recent copy scanned in chart (See row information)   Would patient like information on creating a medical advance directive?       Yes (MAU/Ambulatory/Procedural Areas - Information given)    Current Medications (verified) Outpatient Encounter Medications as of 12/19/2023  Medication Sig   Calcium-Magnesium-Vitamin D (CALCIUM MAGNESIUM PO) Take 4 tablets by mouth daily.    clonazePAM (KLONOPIN) 0.5 MG tablet TK 1/2 T PO BID PRF ANXIETY   denosumab (PROLIA) 60 MG/ML SOSY injection Inject 60 mg into the skin once. ONCE PER YEAR   Omega-3 Fatty Acids (FISH OIL BURP-LESS) 1000 MG CAPS Take by mouth.   RESTASIS 0.05 % ophthalmic emulsion 1 drop 2 (two) times daily.   sertraline (ZOLOFT) 50 MG tablet TAKE 1 TABLET(50 MG) BY MOUTH DAILY   valACYclovir (VALTREX) 1000 MG tablet Take 1 tablet (1,000 mg total) by mouth 2 (two) times daily as needed.   No facility-administered encounter medications on file as of 12/19/2023.    Allergies (verified) Scopolamine and Bisphosphonates   History: Past Medical History:  Diagnosis Date   Anxiety    Cancer (HCC)    breast   Claustrophobia    Depression    GERD (gastroesophageal reflux disease)    H/O cold sores    Malignant neoplasm of left female breast, unspecified estrogen receptor status, unspecified  site of breast (HCC) 12/22/2020   Past Surgical History:  Procedure Laterality Date   FOOT SURGERY Bilateral    MASTECTOMY MODIFIED RADICAL Right 1998   XRT/Chemo + 5 yrs  Aromasin   OOPHORECTOMY Left    Family History  Problem Relation Age of Onset   Diabetes Mother    Parkinson's disease Mother    Breast cancer Mother        84   Heart disease Father    Parkinson's disease Brother    Social History   Socioeconomic History    Marital status: Divorced    Spouse name: Not on file   Number of children: 0   Years of education: Not on file   Highest education level: Master's degree (e.g., MA, MS, MEng, MEd, MSW, MBA)  Occupational History   Occupation: Retired  Tobacco Use   Smoking status: Never   Smokeless tobacco: Never   Tobacco comments:    smoking cessation materials not required  Vaping Use   Vaping status: Never Used  Substance and Sexual Activity   Alcohol use: Yes    Alcohol/week: 2.0 standard drinks of alcohol    Types: 2 Standard drinks or equivalent per week    Comment: occasional   Drug use: No   Sexual activity: Not Currently  Other Topics Concern   Not on file  Social History Narrative   Not on file   Social Drivers of Health   Financial Resource Strain: Low Risk  (12/19/2023)   Overall Financial Resource Strain (CARDIA)    Difficulty of Paying Living Expenses: Not hard at all  Food Insecurity: No Food Insecurity (12/19/2023)   Hunger Vital Sign    Worried About Running Out of Food in the Last Year: Never true    Ran Out of Food in the Last Year: Never true  Transportation Needs: No Transportation Needs (12/19/2023)   PRAPARE - Administrator, Civil Service (Medical): No    Lack of Transportation (Non-Medical): No  Physical Activity: Sufficiently Active (12/19/2023)   Exercise Vital Sign    Days of Exercise per Week: 7 days    Minutes of Exercise per Session: 30 min  Stress: No Stress Concern Present (12/19/2023)   [employer] of Occupational Health - Occupational Stress Questionnaire    Feeling of Stress : Only a little  Social Connections: Moderately Integrated (12/19/2023)   Social Connection and Isolation Panel [NHANES]    Frequency of Communication with Friends and Family: Three times a week    Frequency of Social Gatherings with Friends and Family: Three times a week    Attends Religious Services: More than 4 times per year    Active Member of Clubs or  Organizations: Yes    Attends Engineer, structural: More than 4 times per year    Marital Status: Divorced    Tobacco Counseling Counseling given: Not Answered Tobacco comments: smoking cessation materials not required   Clinical Intake:  Pre-visit preparation completed: Yes  Pain : No/denies pain Pain Score: 0-No pain     BMI - recorded: 19.5 Nutritional Status: BMI of 19-24  Normal Nutritional Risks: None Diabetes: No  How often do you need to have someone help you when you read instructions, pamphlets, or other written materials from your doctor or pharmacy?: 1 - Never  Interpreter Needed?: No  Information entered by :: Kennedy Bucker, LPN   Activities of Daily Living    12/19/2023    8:20 AM 12/18/2023  2:43 PM  In your present state of health, do you have any difficulty performing the following activities:  Hearing? 0 0  Vision? 0 0  Difficulty concentrating or making decisions? 0 0  Walking or climbing stairs? 0 0  Dressing or bathing? 0 0  Doing errands, shopping? 0 0  Preparing Food and eating ? N N  Using the Toilet? N N  In the past six months, have you accidently leaked urine? Y Y  Do you have problems with loss of bowel control? N N  Managing your Medications? N N  Managing your Finances? N N  Housekeeping or managing your Housekeeping? N N    Patient Care Team: Reubin Milan, MD as PCP - General (Internal Medicine) Dr. Doyle Askew (Obstetrics and Gynecology) Wyatt Haste, MD as Consulting Physician (Hematology and Oncology) [facility] Derm (Dermatology) Alex Gardener Victorio Palm, MD as Referring Physician (Ophthalmology)  Indicate any recent Medical Services you may have received from other than [facility] providers in the past year (date may be approximate).     Assessment:   This is a routine wellness examination for Judy Sampson.  Hearing/Vision screen Hearing Screening - Comments:: NO AIDS Vision Screening - Comments:: WEARS  GLASSES ALL THE TIME- DR.TESSER AT [facility] IN [place]   Goals Addressed             This Visit's Progress    Cut out extra servings         Depression Screen    12/19/2023    8:17 AM 11/12/2023    1:35 PM 02/16/2023    2:12 PM 01/30/2023    2:14 PM 01/01/2023    8:32 AM 12/13/2022    8:31 AM 12/28/2021    8:44 AM  PHQ 2/9 Scores  PHQ - 2 Score 0 0 0 0 0 0 0  PHQ- 9 Score 0 3 0 0 1 0 0    Fall Risk    12/19/2023    8:20 AM 12/18/2023    2:43 PM 11/12/2023    1:35 PM 02/16/2023    2:12 PM 01/30/2023    2:14 PM  Fall Risk   Falls in the past year? 0 0 0 0 0  Number falls in past yr: 0 0 0 0 0  Injury with Fall? 0 0 0 0 0  Risk for fall due to : No Fall Risks  No Fall Risks No Fall Risks No Fall Risks  Follow up Falls prevention discussed;Falls evaluation completed  Falls evaluation completed Falls evaluation completed Falls evaluation completed    MEDICARE RISK AT HOME: Medicare Risk at Home Any stairs in or around the home?: Yes If so, are there any without handrails?: No Home free of loose throw rugs in walkways, pet beds, electrical cords, etc?: Yes Adequate lighting in your home to reduce risk of falls?: Yes Life alert?: No Use of a cane, walker or w/c?: No Grab bars in the bathroom?: Yes Shower chair or bench in shower?: No Elevated toilet seat or a handicapped toilet?: No  TIMED UP AND GO:  Was the test performed?  No    Cognitive Function:        12/19/2023    8:21 AM 12/13/2022    8:39 AM 11/27/2018    9:18 AM 11/21/2017    9:04 AM 10/04/2016    8:29 AM  6CIT Screen  What Year? 0 points 0 points 0 points 0 points 0 points  What month? 0 points 0 points 0  points 0 points 0 points  What time? 0 points 0 points 0 points 0 points 0 points  Count back from 20 0 points 0 points 0 points 0 points 0 points  Months in reverse 0 points 0 points 0 points 0 points 0 points  Repeat phrase 0 points 0 points 0 points 0 points 0 points  Total Score 0 points 0  points 0 points 0 points 0 points    Immunizations Immunization History  Administered Date(s) Administered   Fluad Quad(high Dose 65+) 07/09/2022, 08/25/2023   Influenza, High Dose Seasonal PF 07/29/2020   Influenza-Unspecified 07/30/2014, 08/21/2018, 07/20/2019, 08/22/2021   PFIZER(Purple Top)SARS-COV-2 Vaccination 10/15/2019, 11/05/2019, 07/29/2020, 01/29/2021, 08/28/2022   Pfizer Covid-19 Vaccine Bivalent Booster 13yrs & up 08/22/2021   Pfizer(Comirnaty)Fall Seasonal Vaccine 12 years and older 08/29/2023   Pneumococcal Conjugate-13 11/19/2014   Pneumococcal Polysaccharide-23 11/16/2015   Pneumococcal-Unspecified 07/30/2017   Respiratory Syncytial Virus Vaccine,Recomb Aduvanted(Arexvy) 11/03/2022   Tdap 08/31/2015, 10/29/2018   Zoster Recombinant(Shingrix) 06/06/2018, 08/13/2018   Zoster, Live 11/01/2007, 08/21/2017, 01/10/2019    TDAP status: Up to date  Flu Vaccine status: Up to date  Pneumococcal vaccine status: Up to date  Covid-19 vaccine status: Completed vaccines  Qualifies for Shingles Vaccine? Yes   Zostavax completed Yes   Shingrix Completed?: Yes  Screening Tests Health Maintenance  Topic Date Due   Colonoscopy  12/24/2022   COVID-19 Vaccine (8 - 2024-25 season) 10/24/2023   MAMMOGRAM  11/14/2023   Medicare Annual Wellness (AWV)  12/18/2024   DTaP/Tdap/Td (3 - Td or Tdap) 10/29/2028   Pneumonia Vaccine 27+ Years old  Completed   INFLUENZA VACCINE  Completed   DEXA SCAN  Completed   Hepatitis C Screening  Completed   Zoster Vaccines- Shingrix  Completed   HPV VACCINES  Aged Out    Health Maintenance  Health Maintenance Due  Topic Date Due   Colonoscopy  12/24/2022   COVID-19 Vaccine (8 - 2024-25 season) 10/24/2023   MAMMOGRAM  11/14/2023    DECLINED COLONOSCOPY REFERRAL- WANTS TO TALK TO MD FIRST  Mammogram status: Completed 11/20/23. Repeat every year  Bone Density status: Completed 11/20/23. Results reflect: Bone density results: OSTEOPENIA.  Repeat every 5 years.  Lung Cancer Screening: (Low Dose CT Chest recommended if Age 59-80 years, 20 pack-year currently smoking OR have quit w/in 15years.) does not qualify.    Additional Screening:  Hepatitis C Screening: does qualify; Completed 10/04/16  Vision Screening: Recommended annual ophthalmology exams for early detection of glaucoma and other disorders of the eye. Is the patient up to date with their annual eye exam?  Yes  Who is the provider or what is the name of the office in which the patient attends annual eye exams? DR.TESSER If pt is not established with a provider, would they like to be referred to a provider to establish care? No .   Dental Screening: Recommended annual dental exams for proper oral hygiene   Community Resource Referral / Chronic Care Management: CRR required this visit?  No   CCM required this visit?  No     Plan:     I have personally reviewed and noted the following in the patient's chart:   Medical and social history Use of alcohol, tobacco or illicit drugs  Current medications and supplements including opioid prescriptions. Patient is not currently taking opioid prescriptions. Functional ability and status Nutritional status Physical activity Advanced directives List of other physicians Hospitalizations, surgeries, and ER visits in previous 12 months Vitals Screenings  to include cognitive, depression, and falls Referrals and appointments  In addition, I have reviewed and discussed with patient certain preventive protocols, quality metrics, and best practice recommendations. A written personalized care plan for preventive services as well as general preventive health recommendations were provided to patient.     Hal Hope, LPN   [id number]   After Visit Summary: (MyChart) Due to this being a telephonic visit, the after visit summary with patients personalized plan was offered to patient via MyChart   Nurse Notes:  NONE

## 2024-03-11 ENCOUNTER — Encounter: Payer: Self-pay | Admitting: Internal Medicine

## 2024-04-08 ENCOUNTER — Ambulatory Visit (INDEPENDENT_AMBULATORY_CARE_PROVIDER_SITE_OTHER): Admitting: Internal Medicine

## 2024-04-08 ENCOUNTER — Encounter: Payer: Self-pay | Admitting: Internal Medicine

## 2024-04-08 VITALS — BP 118/70 | HR 79 | Ht 68.0 in | Wt 123.2 lb

## 2024-04-08 DIAGNOSIS — F411 Generalized anxiety disorder: Secondary | ICD-10-CM | POA: Diagnosis not present

## 2024-04-08 DIAGNOSIS — Z853 Personal history of malignant neoplasm of breast: Secondary | ICD-10-CM

## 2024-04-08 DIAGNOSIS — M810 Age-related osteoporosis without current pathological fracture: Secondary | ICD-10-CM | POA: Diagnosis not present

## 2024-04-08 DIAGNOSIS — Z0000 Encounter for general adult medical examination without abnormal findings: Secondary | ICD-10-CM

## 2024-04-08 DIAGNOSIS — R42 Dizziness and giddiness: Secondary | ICD-10-CM

## 2024-04-08 DIAGNOSIS — E785 Hyperlipidemia, unspecified: Secondary | ICD-10-CM | POA: Diagnosis not present

## 2024-04-08 DIAGNOSIS — Z1211 Encounter for screening for malignant neoplasm of colon: Secondary | ICD-10-CM | POA: Diagnosis not present

## 2024-04-08 DIAGNOSIS — K2900 Acute gastritis without bleeding: Secondary | ICD-10-CM

## 2024-04-08 DIAGNOSIS — E559 Vitamin D deficiency, unspecified: Secondary | ICD-10-CM | POA: Diagnosis not present

## 2024-04-08 MED ORDER — SERTRALINE HCL 50 MG PO TABS: ORAL_TABLET | ORAL | 1 refills | Status: AC

## 2024-04-08 NOTE — Assessment & Plan Note (Signed)
 Clinically stable on Zoloft .   No SI or HI on evaluation. She is having lightheadedness that she believes is stress related due to issues with her daughter. Plan to continue same medications for now.  Follow up if symptoms worsen.

## 2024-04-08 NOTE — Assessment & Plan Note (Signed)
 Managed with diet and exercise. Pt has declined statin therapy Lab Results  Component Value Date   LDLCALC 139 (H) 01/01/2023

## 2024-04-08 NOTE — Assessment & Plan Note (Signed)
Continue daily supplement 

## 2024-04-08 NOTE — Progress Notes (Signed)
 Date:  04/08/2024   Name:  Judy Sampson   DOB:  1945-11-17   MRN:  [id number]   Chief Complaint: Annual Exam Judy Sampson is a 78 y.o. female who presents today for her Complete Annual Exam. She feels well. She reports exercising yoga 3 times a week, garden and strengthen classes. She reports she is sleeping well. Breast complaints none.  Health Maintenance  Topic Date Due   Colon Cancer Screening  12/24/2022   COVID-19 Vaccine (8 - Pfizer risk 2024-25 season) 02/27/2024   Flu Shot  05/30/2024   Mammogram  11/19/2024   Medicare Annual Wellness Visit  12/18/2024   DTaP/Tdap/Td vaccine (3 - Td or Tdap) 10/29/2028   Pneumonia Vaccine  Completed   DEXA scan (bone density measurement)  Completed   Hepatitis C Screening  Completed   Zoster (Shingles) Vaccine  Completed   HPV Vaccine  Aged Out   Meningitis B Vaccine  Aged Out     Abdominal Pain This is a new problem. The current episode started 1 to 4 weeks ago. The onset quality is sudden. The problem occurs constantly. The problem has been resolved (mostly resolved about 5 days ago). The pain is mild. The abdominal pain radiates to the RUQ. Associated symptoms include belching and flatus. Pertinent negatives include no arthralgias, constipation, diarrhea, dysuria, fever, headaches, hematuria or myalgias. Nothing aggravates the pain. The pain is relieved by Nothing.  Dizziness This is a recurrent problem. The current episode started more than 1 month ago. The problem occurs every several days. The problem has been unchanged. Associated symptoms include abdominal pain. Pertinent negatives include no arthralgias, chest pain, coughing, fatigue, fever, headaches, myalgias, visual change or weakness. The symptoms are aggravated by stress. Treatments tried: taking Zoloft .    Review of Systems  Constitutional:  Negative for fatigue, fever and unexpected weight change.  HENT:  Negative for trouble swallowing.   Eyes:  Negative for visual  disturbance.  Respiratory:  Negative for cough, chest tightness, shortness of breath and wheezing.   Cardiovascular:  Negative for chest pain, palpitations and leg swelling.  Gastrointestinal:  Positive for abdominal pain and flatus. Negative for constipation and diarrhea.  Genitourinary:  Negative for dysuria and hematuria.  Musculoskeletal:  Negative for arthralgias and myalgias.  Neurological:  Positive for dizziness. Negative for weakness, light-headedness and headaches.  Psychiatric/Behavioral:  Negative for dysphoric mood and sleep disturbance. The patient is not nervous/anxious.      Lab Results  Component Value Date   NA 135 01/01/2023   K 4.1 01/01/2023   CO2 23 01/01/2023   GLUCOSE 87 01/01/2023   BUN 13 01/01/2023   CREATININE 0.86 01/01/2023   CALCIUM 9.7 01/01/2023   EGFR 71 01/01/2023   GFRNONAA 60 11/16/2020   Lab Results  Component Value Date   CHOL 228 (H) 01/01/2023   HDL 79 01/01/2023   LDLCALC 139 (H) 01/01/2023   TRIG 61 01/01/2023   CHOLHDL 2.9 01/01/2023   Lab Results  Component Value Date   TSH 1.220 01/01/2023   No results found for: "HGBA1C" Lab Results  Component Value Date   WBC 4.6 01/01/2023   HGB 13.3 01/01/2023   HCT 40.7 01/01/2023   MCV 89 01/01/2023   PLT 162 01/01/2023   Lab Results  Component Value Date   ALT 13 01/01/2023   AST 19 01/01/2023   ALKPHOS 75 01/01/2023   BILITOT 0.5 01/01/2023   Lab Results  Component Value Date   VD25OH 30.7 01/01/2023  Patient Active Problem List   Diagnosis Date Noted   Palpitations 01/30/2023   Mild hyperlipidemia 12/22/2020   Generalized anxiety disorder 11/20/2019   Vitamin D  deficiency 11/20/2019   Adenomatous polyp of colon 07/03/2017   Muscle spasms of neck 10/04/2016   Cardiac arrhythmia 10/04/2016   Hx of herpes genitalis 05/26/2015   Osteoporosis 05/26/2015   Claustrophobia 05/26/2015   Acid reflux 05/26/2015   Personal history of malignant neoplasm of breast  10/30/1996    Allergies  Allergen Reactions   Scopolamine  Nausea And Vomiting    headache   Bisphosphonates     Past Surgical History:  Procedure Laterality Date   FOOT SURGERY Bilateral    MASTECTOMY MODIFIED RADICAL Right 1998   XRT/Chemo + 5 yrs  Aromasin   OOPHORECTOMY Left     Social History   Tobacco Use   Smoking status: Never   Smokeless tobacco: Never   Tobacco comments:    smoking cessation materials not required  Vaping Use   Vaping status: Never Used  Substance Use Topics   Alcohol use: Yes    Alcohol/week: 2.0 standard drinks of alcohol    Types: 2 Standard drinks or equivalent per week    Comment: occasional   Drug use: No     Medication list has been reviewed and updated.  Current Meds  Medication Sig   Ascorbic Acid (VITAMIN C) 1000 MG tablet Take 500 mg by mouth daily.   Calcium-Magnesium-Vitamin D  (CALCIUM MAGNESIUM PO) Take 4 tablets by mouth daily.    clonazePAM  (KLONOPIN ) 0.5 MG tablet TK 1/2 T PO BID PRF ANXIETY   denosumab  (PROLIA ) 60 MG/ML SOSY injection Inject 60 mg into the skin once. ONCE PER YEAR   Omega-3 Fatty Acids (FISH OIL BURP-LESS) 1000 MG CAPS Take by mouth.   RESTASIS 0.05 % ophthalmic emulsion 1 drop 2 (two) times daily.   valACYclovir  (VALTREX ) 1000 MG tablet Take 1 tablet (1,000 mg total) by mouth 2 (two) times daily as needed.   [DISCONTINUED] sertraline  (ZOLOFT ) 50 MG tablet TAKE 1 TABLET(50 MG) BY MOUTH DAILY       04/08/2024    2:14 PM 11/12/2023    1:35 PM 02/16/2023    2:12 PM 01/30/2023    2:14 PM  GAD 7 : Generalized Anxiety Score  Nervous, Anxious, on Edge 3 0 3 3  Control/stop worrying 0 0 3 2  Worry too much - different things 1 0 3 2  Trouble relaxing 1 0 3 2  Restless 0 0 0 1  Easily annoyed or irritable 0 0 0 1  Afraid - awful might happen 0 0 3 2  Total GAD 7 Score 5 0 15 13  Anxiety Difficulty Not difficult at all Not difficult at all Extremely difficult Somewhat difficult       04/08/2024    2:13  PM 12/19/2023    8:17 AM 11/12/2023    1:35 PM  Depression screen PHQ 2/9  Decreased Interest 0 0 0  Down, Depressed, Hopeless 1 0 0  PHQ - 2 Score 1 0 0  Altered sleeping 0 0 0  Tired, decreased energy 0 0 3  Change in appetite 0 0 0  Feeling bad or failure about yourself  0 0 0  Trouble concentrating 0 0 0  Moving slowly or fidgety/restless 0 0 0  Suicidal thoughts 0 0 0  PHQ-9 Score 1 0 3  Difficult doing work/chores Not difficult at all Not difficult at all Not difficult at  all    BP Readings from Last 3 Encounters:  04/08/24 118/70  11/12/23 118/62  01/30/23 122/70    Physical Exam Vitals and nursing note reviewed.  Constitutional:      General: She is not in acute distress.    Appearance: She is well-developed.  HENT:     Head: Normocephalic and atraumatic.     Right Ear: Tympanic membrane and ear canal normal.     Left Ear: Tympanic membrane and ear canal normal.     Nose:     Right Sinus: No maxillary sinus tenderness.     Left Sinus: No maxillary sinus tenderness.  Eyes:     General: No scleral icterus.       Right eye: No discharge.        Left eye: No discharge.     Conjunctiva/sclera: Conjunctivae normal.  Neck:     Thyroid : No thyromegaly.     Vascular: No carotid bruit or JVD.  Cardiovascular:     Rate and Rhythm: Normal rate and regular rhythm.     Pulses: Normal pulses.     Heart sounds: Normal heart sounds.  Pulmonary:     Effort: Pulmonary effort is normal. No respiratory distress.     Breath sounds: No wheezing.  Abdominal:     General: Bowel sounds are normal.     Palpations: Abdomen is soft.     Tenderness: There is abdominal tenderness (mild RUQ tenderness). There is no right CVA tenderness, guarding or rebound.  Musculoskeletal:     Cervical back: Normal range of motion. No erythema.     Right lower leg: No edema.     Left lower leg: No edema.  Lymphadenopathy:     Cervical: No cervical adenopathy.  Skin:    General: Skin is warm and  dry.     Capillary Refill: Capillary refill takes less than 2 seconds.     Findings: No rash.  Neurological:     General: No focal deficit present.     Mental Status: She is alert and oriented to person, place, and time.     Cranial Nerves: No cranial nerve deficit.     Sensory: No sensory deficit.     Deep Tendon Reflexes: Reflexes are normal and symmetric.  Psychiatric:        Attention and Perception: Attention normal.        Mood and Affect: Mood normal.     Wt Readings from Last 3 Encounters:  04/08/24 123 lb 4 oz (55.9 kg)  11/12/23 128 lb (58.1 kg)  02/16/23 133 lb 9.6 oz (60.6 kg)    BP 118/70   Pulse 79   Ht 5\' 8"  (1.727 m)   Wt 123 lb 4 oz (55.9 kg)   SpO2 99%   BMI 18.74 kg/m   Assessment and Plan:  Problem List Items Addressed This Visit       Unprioritized   Osteoporosis (Chronic)   Recent DEXA 10/2023 showed osteopenia. Continue Calcium and vitamin D  and Prolia       Personal history of malignant neoplasm of breast (Chronic)   Relevant Orders   CBC with Differential/Platelet   Comprehensive metabolic panel with GFR   Generalized anxiety disorder (Chronic)   Clinically stable on Zoloft .   No SI or HI on evaluation. She is having lightheadedness that she believes is stress related due to issues with her daughter. Plan to continue same medications for now.  Follow up if symptoms worsen.  Relevant Medications   sertraline  (ZOLOFT ) 50 MG tablet   Other Relevant Orders   TSH   Vitamin D  deficiency   Continue daily supplement      Relevant Orders   VITAMIN D  25 Hydroxy (Vit-D Deficiency, Fractures)   Mild hyperlipidemia (Chronic)   Managed with diet and exercise. Pt has declined statin therapy Lab Results  Component Value Date   LDLCALC 139 (H) 01/01/2023         Relevant Orders   Comprehensive metabolic panel with GFR   Lipid panel   TSH   Other Visit Diagnoses       Annual physical exam    -  Primary   up to date on screenings  and immunizations     Colon cancer screening       Relevant Orders   Ambulatory referral to Gastroenterology     Episodic lightheadedness       may be stress related - if no improvement, recommend cardiac monitor and Carotid US    Relevant Orders   Comprehensive metabolic panel with GFR   TSH     Acute gastritis without hemorrhage, unspecified gastritis type       Recommend 2 weeks course of Pepcid for acid reduction follow up if symptoms recur   Relevant Orders   CBC with Differential/Platelet       No follow-ups on file.    Sheron Dixons, MD [facility] [place]

## 2024-04-08 NOTE — Assessment & Plan Note (Addendum)
 Recent DEXA 10/2023 showed osteopenia. Continue Calcium and vitamin D  and Prolia 

## 2024-04-15 ENCOUNTER — Ambulatory Visit: Payer: Self-pay | Admitting: Internal Medicine

## 2024-04-15 LAB — COMPREHENSIVE METABOLIC PANEL WITH GFR
ALT: 14 IU/L (ref 0–32)
AST: 21 IU/L (ref 0–40)
Albumin: 4 g/dL (ref 3.8–4.8)
Alkaline Phosphatase: 54 IU/L (ref 44–121)
BUN/Creatinine Ratio: 15 (ref 12–28)
BUN: 13 mg/dL (ref 8–27)
Bilirubin Total: 0.5 mg/dL (ref 0.0–1.2)
CO2: 22 mmol/L (ref 20–29)
Calcium: 9 mg/dL (ref 8.7–10.3)
Chloride: 100 mmol/L (ref 96–106)
Creatinine, Ser: 0.86 mg/dL (ref 0.57–1.00)
Globulin, Total: 2.4 g/dL (ref 1.5–4.5)
Glucose: 85 mg/dL (ref 70–99)
Potassium: 4.1 mmol/L (ref 3.5–5.2)
Sodium: 136 mmol/L (ref 134–144)
Total Protein: 6.4 g/dL (ref 6.0–8.5)
eGFR: 70 mL/min/{1.73_m2} (ref >59)

## 2024-04-15 LAB — CBC WITH DIFFERENTIAL/PLATELET
Basophils Absolute: 0 10*3/uL (ref 0.0–0.2)
Basos: 1 %
EOS (ABSOLUTE): 0.1 10*3/uL (ref 0.0–0.4)
Eos: 3 %
Hematocrit: 39.6 % (ref 34.0–46.6)
Hemoglobin: 13 g/dL (ref 11.1–15.9)
Immature Grans (Abs): 0 10*3/uL (ref 0.0–0.1)
Immature Granulocytes: 0 %
Lymphocytes Absolute: 1.8 10*3/uL (ref 0.7–3.1)
Lymphs: 48 %
MCH: 30.4 pg (ref 26.6–33.0)
MCHC: 32.8 g/dL (ref 31.5–35.7)
MCV: 93 fL (ref 79–97)
Monocytes Absolute: 0.4 10*3/uL (ref 0.1–0.9)
Monocytes: 10 %
Neutrophils Absolute: 1.4 10*3/uL (ref 1.4–7.0)
Neutrophils: 37 %
Platelets: 151 10*3/uL (ref 150–450)
RBC: 4.27 x10E6/uL (ref 3.77–5.28)
RDW: 13.4 % (ref 11.7–15.4)
WBC: 3.7 10*3/uL (ref 3.4–10.8)

## 2024-04-15 LAB — LIPID PANEL
Chol/HDL Ratio: 2.7 ratio (ref 0.0–4.4)
Cholesterol, Total: 201 mg/dL — ABNORMAL HIGH (ref 100–199)
HDL: 74 mg/dL (ref >39)
LDL Chol Calc (NIH): 118 mg/dL — ABNORMAL HIGH (ref 0–99)
Triglycerides: 50 mg/dL (ref 0–149)
VLDL Cholesterol Cal: 9 mg/dL (ref 5–40)

## 2024-04-15 LAB — TSH: TSH: 1.34 u[IU]/mL (ref 0.450–4.500)

## 2024-04-15 LAB — VITAMIN D 25 HYDROXY (VIT D DEFICIENCY, FRACTURES): Vit D, 25-Hydroxy: 31.7 ng/mL (ref 30.0–100.0)

## 2024-06-24 ENCOUNTER — Telehealth: Payer: Self-pay | Admitting: Internal Medicine

## 2024-06-24 NOTE — Telephone Encounter (Signed)
 Sent my chart message informing pt we do not refer to hospitals but only providers. The referral has already been put in [facility] system for this. She just needs to call and schedule. [facility]:  [phone]  - Durward Matranga M.

## 2024-06-24 NOTE — Telephone Encounter (Signed)
 Copied from CRM #[id number]. Topic: Referral - Question >> Jun 24, 2024 10:48 AM Winona SAUNDERS wrote: Pt would like for to have her referral sent to [facility] ([facility]) - AMB REFERRAL TO GASTROENTEROLOGY

## 2024-06-26 DIAGNOSIS — M25511 Pain in right shoulder: Secondary | ICD-10-CM | POA: Diagnosis not present

## 2024-06-26 DIAGNOSIS — M62830 Muscle spasm of back: Secondary | ICD-10-CM | POA: Diagnosis not present

## 2024-06-26 DIAGNOSIS — M9904 Segmental and somatic dysfunction of sacral region: Secondary | ICD-10-CM | POA: Diagnosis not present

## 2024-06-26 DIAGNOSIS — M60811 Other myositis, right shoulder: Secondary | ICD-10-CM | POA: Diagnosis not present

## 2024-06-26 DIAGNOSIS — M9902 Segmental and somatic dysfunction of thoracic region: Secondary | ICD-10-CM | POA: Diagnosis not present

## 2024-06-26 DIAGNOSIS — M9901 Segmental and somatic dysfunction of cervical region: Secondary | ICD-10-CM | POA: Diagnosis not present

## 2024-06-26 DIAGNOSIS — M60841 Other myositis, right hand: Secondary | ICD-10-CM | POA: Diagnosis not present

## 2024-07-04 DIAGNOSIS — Z860101 Personal history of adenomatous and serrated colon polyps: Secondary | ICD-10-CM | POA: Diagnosis not present

## 2024-07-04 DIAGNOSIS — K5520 Angiodysplasia of colon without hemorrhage: Secondary | ICD-10-CM | POA: Diagnosis not present

## 2024-07-04 DIAGNOSIS — K635 Polyp of colon: Secondary | ICD-10-CM | POA: Diagnosis not present

## 2024-07-04 DIAGNOSIS — D126 Benign neoplasm of colon, unspecified: Secondary | ICD-10-CM | POA: Diagnosis not present

## 2024-07-04 DIAGNOSIS — Z1211 Encounter for screening for malignant neoplasm of colon: Secondary | ICD-10-CM | POA: Diagnosis not present

## 2024-07-04 DIAGNOSIS — D123 Benign neoplasm of transverse colon: Secondary | ICD-10-CM | POA: Diagnosis not present

## 2024-07-31 DIAGNOSIS — H25813 Combined forms of age-related cataract, bilateral: Secondary | ICD-10-CM | POA: Diagnosis not present

## 2024-07-31 DIAGNOSIS — H16223 Keratoconjunctivitis sicca, not specified as Sjogren's, bilateral: Secondary | ICD-10-CM | POA: Diagnosis not present

## 2024-10-02 DIAGNOSIS — M530 Cervicocranial syndrome: Secondary | ICD-10-CM | POA: Diagnosis not present

## 2024-10-02 DIAGNOSIS — M9901 Segmental and somatic dysfunction of cervical region: Secondary | ICD-10-CM | POA: Diagnosis not present

## 2024-10-02 DIAGNOSIS — M7741 Metatarsalgia, right foot: Secondary | ICD-10-CM | POA: Diagnosis not present

## 2024-10-02 DIAGNOSIS — M25511 Pain in right shoulder: Secondary | ICD-10-CM | POA: Diagnosis not present

## 2024-10-02 DIAGNOSIS — M9902 Segmental and somatic dysfunction of thoracic region: Secondary | ICD-10-CM | POA: Diagnosis not present

## 2024-10-02 DIAGNOSIS — M9904 Segmental and somatic dysfunction of sacral region: Secondary | ICD-10-CM | POA: Diagnosis not present

## 2024-10-02 DIAGNOSIS — M62830 Muscle spasm of back: Secondary | ICD-10-CM | POA: Diagnosis not present

## 2024-12-01 ENCOUNTER — Other Ambulatory Visit: Payer: Self-pay | Admitting: Internal Medicine

## 2024-12-01 DIAGNOSIS — B009 Herpesviral infection, unspecified: Secondary | ICD-10-CM

## 2024-12-25 ENCOUNTER — Encounter
